# Patient Record
Sex: Female | Born: 1955 | Race: White | Hispanic: No | Marital: Married | Smoking: Never smoker
Health system: Southern US, Community
[De-identification: ages and names within clinical notes are randomized; demographics above are authoritative.]

## PROBLEM LIST (undated history)

## (undated) DIAGNOSIS — I341 Nonrheumatic mitral (valve) prolapse: Secondary | ICD-10-CM

## (undated) DIAGNOSIS — E785 Hyperlipidemia, unspecified: Secondary | ICD-10-CM

## (undated) DIAGNOSIS — F0390 Unspecified dementia without behavioral disturbance: Secondary | ICD-10-CM

## (undated) DIAGNOSIS — F0280 Dementia in other diseases classified elsewhere without behavioral disturbance: Secondary | ICD-10-CM

## (undated) DIAGNOSIS — R011 Cardiac murmur, unspecified: Secondary | ICD-10-CM

## (undated) HISTORY — PX: BREAST CYST ASPIRATION: SHX578

## (undated) HISTORY — PX: BREAST EXCISIONAL BIOPSY: SUR124

## (undated) HISTORY — PX: CHOLECYSTECTOMY: SHX55

## (undated) HISTORY — PX: BIOPSY BREAST: PRO8

---

## 2004-01-16 ENCOUNTER — Ambulatory Visit: Payer: Self-pay | Admitting: Unknown Physician Specialty

## 2005-02-22 ENCOUNTER — Ambulatory Visit: Payer: Self-pay | Admitting: Unknown Physician Specialty

## 2005-02-23 ENCOUNTER — Ambulatory Visit: Payer: Self-pay | Admitting: Unknown Physician Specialty

## 2005-12-20 ENCOUNTER — Ambulatory Visit: Payer: Self-pay | Admitting: Neurology

## 2006-07-13 ENCOUNTER — Ambulatory Visit: Payer: Self-pay | Admitting: Unknown Physician Specialty

## 2006-09-06 ENCOUNTER — Ambulatory Visit: Payer: Self-pay | Admitting: General Surgery

## 2006-11-25 ENCOUNTER — Ambulatory Visit: Payer: Self-pay | Admitting: Internal Medicine

## 2007-08-03 ENCOUNTER — Ambulatory Visit: Payer: Self-pay | Admitting: Unknown Physician Specialty

## 2007-08-17 ENCOUNTER — Ambulatory Visit: Payer: Self-pay | Admitting: Unknown Physician Specialty

## 2008-10-16 ENCOUNTER — Ambulatory Visit: Payer: Self-pay | Admitting: Unknown Physician Specialty

## 2009-10-26 ENCOUNTER — Emergency Department: Payer: Self-pay | Admitting: Internal Medicine

## 2009-11-02 ENCOUNTER — Ambulatory Visit: Payer: Self-pay | Admitting: Internal Medicine

## 2009-11-04 ENCOUNTER — Ambulatory Visit: Payer: Self-pay | Admitting: Unknown Physician Specialty

## 2010-11-17 ENCOUNTER — Ambulatory Visit: Payer: Self-pay | Admitting: Unknown Physician Specialty

## 2010-11-18 ENCOUNTER — Ambulatory Visit: Payer: Self-pay | Admitting: Unknown Physician Specialty

## 2011-02-11 ENCOUNTER — Observation Stay: Payer: Self-pay | Admitting: Internal Medicine

## 2011-11-22 ENCOUNTER — Ambulatory Visit: Payer: Self-pay | Admitting: Unknown Physician Specialty

## 2013-03-26 ENCOUNTER — Ambulatory Visit: Payer: Self-pay | Admitting: Unknown Physician Specialty

## 2014-12-09 ENCOUNTER — Emergency Department: Payer: BC Managed Care – PPO

## 2014-12-09 ENCOUNTER — Encounter: Payer: Self-pay | Admitting: Emergency Medicine

## 2014-12-09 DIAGNOSIS — R079 Chest pain, unspecified: Secondary | ICD-10-CM | POA: Insufficient documentation

## 2014-12-09 LAB — COMPREHENSIVE METABOLIC PANEL
ALBUMIN: 4.6 g/dL (ref 3.5–5.0)
ALK PHOS: 91 U/L (ref 38–126)
ALT: 19 U/L (ref 14–54)
AST: 19 U/L (ref 15–41)
Anion gap: 6 (ref 5–15)
BUN: 15 mg/dL (ref 6–20)
CALCIUM: 9.4 mg/dL (ref 8.9–10.3)
CO2: 30 mmol/L (ref 22–32)
Chloride: 100 mmol/L — ABNORMAL LOW (ref 101–111)
Creatinine, Ser: 0.78 mg/dL (ref 0.44–1.00)
GFR calc Af Amer: >60 mL/min (ref >60)
GFR calc non Af Amer: >60 mL/min (ref >60)
GLUCOSE: 103 mg/dL — AB (ref 65–99)
POTASSIUM: 3.5 mmol/L (ref 3.5–5.1)
Sodium: 136 mmol/L (ref 135–145)
Total Bilirubin: 0.6 mg/dL (ref 0.3–1.2)
Total Protein: 7.3 g/dL (ref 6.5–8.1)

## 2014-12-09 LAB — CBC
HEMATOCRIT: 41 % (ref 35.0–47.0)
Hemoglobin: 14.2 g/dL (ref 12.0–16.0)
MCH: 30.8 pg (ref 26.0–34.0)
MCHC: 34.6 g/dL (ref 32.0–36.0)
MCV: 89.1 fL (ref 80.0–100.0)
Platelets: 245 10*3/uL (ref 150–440)
RBC: 4.61 MIL/uL (ref 3.80–5.20)
RDW: 13.2 % (ref 11.5–14.5)
WBC: 5.3 10*3/uL (ref 3.6–11.0)

## 2014-12-09 LAB — TROPONIN I

## 2014-12-09 NOTE — ED Notes (Signed)
Pt arrived to the ED for complaints of chest pain and "feeling tired all the time." Pt states that she went to see her primary health care provider and she told him about the symptoms and he referred her to the ED for suspicion of an aortic aneurism, since her father had one for 15 years. Pt also reports that she had has mitral valve prolapse and she has recurrent cardiac symptoms. Pt is AOx4  In no apparent distress in triage.

## 2014-12-10 ENCOUNTER — Emergency Department
Admission: EM | Admit: 2014-12-10 | Discharge: 2014-12-10 | Discharge status: Against Medical Advice | Payer: BC Managed Care – PPO | Attending: Emergency Medicine | Admitting: Emergency Medicine

## 2014-12-10 HISTORY — DX: Nonrheumatic mitral (valve) prolapse: I34.1

## 2014-12-10 HISTORY — DX: Cardiac murmur, unspecified: R01.1

## 2014-12-10 HISTORY — DX: Hyperlipidemia, unspecified: E78.5

## 2014-12-11 ENCOUNTER — Telehealth: Payer: Self-pay | Admitting: Emergency Medicine

## 2014-12-11 NOTE — ED Notes (Signed)
Called patient due to lwot to inquire about condition and follow up plans. Left message with my number. 

## 2015-07-31 ENCOUNTER — Other Ambulatory Visit: Payer: Self-pay | Admitting: Physician Assistant

## 2015-07-31 DIAGNOSIS — Z1231 Encounter for screening mammogram for malignant neoplasm of breast: Secondary | ICD-10-CM

## 2015-08-12 ENCOUNTER — Ambulatory Visit: Payer: BC Managed Care – PPO | Attending: Physician Assistant

## 2015-09-12 ENCOUNTER — Other Ambulatory Visit: Payer: Self-pay | Admitting: Physician Assistant

## 2015-09-12 ENCOUNTER — Ambulatory Visit
Admission: RE | Admit: 2015-09-12 | Discharge: 2015-09-12 | Disposition: A | Discharge status: Home / Self Care | Payer: BC Managed Care – PPO | Source: Ambulatory Visit | Attending: Physician Assistant | Admitting: Physician Assistant

## 2015-09-12 DIAGNOSIS — Z1231 Encounter for screening mammogram for malignant neoplasm of breast: Secondary | ICD-10-CM | POA: Diagnosis present

## 2016-07-27 ENCOUNTER — Other Ambulatory Visit: Payer: Self-pay | Admitting: Neurology

## 2016-07-27 DIAGNOSIS — R208 Other disturbances of skin sensation: Secondary | ICD-10-CM

## 2016-08-05 ENCOUNTER — Other Ambulatory Visit: Payer: Self-pay | Admitting: Physician Assistant

## 2016-08-05 DIAGNOSIS — Z1231 Encounter for screening mammogram for malignant neoplasm of breast: Secondary | ICD-10-CM

## 2016-08-10 ENCOUNTER — Ambulatory Visit: Payer: BC Managed Care – PPO

## 2016-08-19 ENCOUNTER — Ambulatory Visit
Admission: RE | Admit: 2016-08-19 | Discharge: 2016-08-19 | Disposition: A | Discharge status: Home / Self Care | Payer: BC Managed Care – PPO | Source: Ambulatory Visit | Attending: Neurology | Admitting: Neurology

## 2016-08-19 DIAGNOSIS — R208 Other disturbances of skin sensation: Secondary | ICD-10-CM | POA: Insufficient documentation

## 2016-08-19 MED ORDER — GADOBENATE DIMEGLUMINE 529 MG/ML IV SOLN
15.0000 mL | Freq: Once | INTRAVENOUS | Status: AC | PRN
Start: 2016-08-19 — End: 2016-08-19
  Administered 2016-08-19: 14 mL via INTRAVENOUS

## 2016-09-14 ENCOUNTER — Ambulatory Visit: Payer: BC Managed Care – PPO

## 2016-09-14 ENCOUNTER — Ambulatory Visit
Admission: RE | Admit: 2016-09-14 | Discharge: 2016-09-14 | Disposition: A | Discharge status: Home / Self Care | Payer: BC Managed Care – PPO | Source: Ambulatory Visit | Attending: Physician Assistant | Admitting: Physician Assistant

## 2016-09-14 DIAGNOSIS — Z1231 Encounter for screening mammogram for malignant neoplasm of breast: Secondary | ICD-10-CM | POA: Diagnosis present

## 2016-12-06 ENCOUNTER — Other Ambulatory Visit: Payer: Self-pay | Admitting: Neurology

## 2016-12-06 DIAGNOSIS — M542 Cervicalgia: Secondary | ICD-10-CM

## 2016-12-13 ENCOUNTER — Ambulatory Visit
Admission: RE | Admit: 2016-12-13 | Discharge: 2016-12-13 | Disposition: A | Discharge status: Home / Self Care | Payer: BC Managed Care – PPO | Source: Ambulatory Visit | Attending: Neurology | Admitting: Neurology

## 2016-12-13 DIAGNOSIS — M542 Cervicalgia: Secondary | ICD-10-CM

## 2016-12-13 DIAGNOSIS — M5030 Other cervical disc degeneration, unspecified cervical region: Secondary | ICD-10-CM | POA: Diagnosis not present

## 2016-12-13 DIAGNOSIS — M4802 Spinal stenosis, cervical region: Secondary | ICD-10-CM | POA: Diagnosis not present

## 2017-06-09 ENCOUNTER — Emergency Department
Admission: EM | Admit: 2017-06-09 | Discharge: 2017-06-09 | Disposition: A | Discharge status: Home / Self Care | Payer: BC Managed Care – PPO | Attending: Emergency Medicine | Admitting: Emergency Medicine

## 2017-06-09 ENCOUNTER — Encounter: Payer: Self-pay | Admitting: Emergency Medicine

## 2017-06-09 DIAGNOSIS — B349 Viral infection, unspecified: Secondary | ICD-10-CM | POA: Insufficient documentation

## 2017-06-09 DIAGNOSIS — R52 Pain, unspecified: Secondary | ICD-10-CM | POA: Diagnosis present

## 2017-06-09 LAB — INFLUENZA PANEL BY PCR (TYPE A & B)
INFLBPCR: NEGATIVE
Influenza A By PCR: NEGATIVE

## 2017-06-09 MED ORDER — NAPROXEN 500 MG PO TABS: 500.0000 mg | ORAL_TABLET | Freq: Two times a day (BID) | ORAL | 0 refills | Status: DC

## 2017-06-09 MED ORDER — ONDANSETRON 4 MG PO TBDP: 4.0000 mg | ORAL_TABLET | Freq: Three times a day (TID) | ORAL | 0 refills | Status: DC | PRN

## 2017-06-09 MED ORDER — NAPROXEN 500 MG PO TABS
500.0000 mg | ORAL_TABLET | Freq: Once | ORAL | Status: AC
  Administered 2017-06-09: 500 mg via ORAL
  Filled 2017-06-09: qty 1

## 2017-06-09 MED ORDER — ONDANSETRON 4 MG PO TBDP
4.0000 mg | ORAL_TABLET | Freq: Once | ORAL | Status: AC
  Administered 2017-06-09: 4 mg via ORAL
  Filled 2017-06-09: qty 1

## 2017-06-09 NOTE — ED Triage Notes (Signed)
Pt comes into the ED via POV c/o generalized body aches, headache, fever, and emesis x 3 today.  Patient in NAD at this time with even and unlabored respirations.  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  Patient in NAD with even and unlabored respirations.  Patient took tylenol at 18:00 and is afebrile at this time.

## 2017-06-09 NOTE — ED Provider Notes (Signed)
[facility] Emergency Department Provider Note  ____________________________________________  Time seen: Approximately 9:39 PM  I have reviewed the triage vital signs and the nursing notes.   HISTORY  Chief Complaint Generalized Body Aches and Fever   HPI Susan Patterson is a 62 y.o. female who presents to the emergency department for evaluation of generalized fever, body aches, nausea, malaise which started this morning. She denies cough, congestion, or abdominal pain. 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 No recent travel. She did have her flu shot. She has taken tylenol with relief.   Past Medical History:  Diagnosis Date  . Heart murmur   . Hyperlipidemia   . Mitral valve prolapse     There are no active problems to display for this patient.   Past Surgical History:  Procedure Laterality Date  . BIOPSY BREAST    . BREAST CYST ASPIRATION Left 1990'S  . BREAST EXCISIONAL BIOPSY Left 1990's   benign  . CHOLECYSTECTOMY      Prior to Admission medications   Medication Sig Start Date End Date Taking? Authorizing Provider  naproxen (NAPROSYN) 500 MG tablet Take 1 tablet (500 mg total) by mouth 2 (two) times daily with a meal. 06/09/17   Lestat Golob B, FNP  ondansetron (ZOFRAN-ODT) 4 MG disintegrating tablet Take 1 tablet (4 mg total) by mouth every 8 (eight) hours as needed for nausea or vomiting. 06/09/17   Chinita Pesterriplett, Aniylah Avans B, FNP    Allergies Patient has no known allergies.  Family History  Problem Relation Age of Onset  . AAA (abdominal aortic aneurysm) Father   . Breast cancer Maternal Aunt     Social History Social History   Tobacco Use  . Smoking status: Never Smoker  . Smokeless tobacco: Never Used  Substance Use Topics  . Alcohol use: No  . Drug use: No    Review of Systems Constitutional: Positive fever/chills ENT: Negative sore throat. Cardiovascular: Denies chest pain. Respiratory: Negative for  shortness of breath. Negative for cough. Gastrointestinal: Positive for nausea,  no vomiting.  Negative for diarrhea.  Musculoskeletal: Positive for body aches Skin: Negative for rash. Neurological: Positive for headaches ____________________________________________   PHYSICAL EXAM:  VITAL SIGNS: ED Triage Vitals  Enc Vitals Group     BP 06/09/17 1928 102/77     Pulse Rate 06/09/17 1928 89     Resp 06/09/17 1928 16     Temp 06/09/17 1928 99 F (37.2 C)     Temp Source 06/09/17 1928 Oral     SpO2 06/09/17 1928 96 %     Weight 06/09/17 1930 145 lb (65.8 kg)     Height 06/09/17 1930 5\' 6"  (1.676 m)     Head Circumference --      Peak Flow --      Pain Score 06/09/17 1929 8     Pain Loc --      Pain Edu? --      Excl. in [place]? --     Constitutional: Alert and oriented. Acutely ill appearing and in no acute distress. Eyes: Conjunctivae are normal. EOMI. Ears: Bilateral TM normal Nose: No sinus congestion noted; no rhinnorhea. Mouth/Throat: Mucous membranes are moist.  Oropharynx normal without erythema. Tonsils normal without exudate. Neck: No stridor.  Lymphatic: Tender anterior cervical lymphadenopathy. Cardiovascular: Normal rate, regular rhythm. Good peripheral circulation. Respiratory: Normal respiratory effort.  No retractions. Breath sounds clear to auscultation. Gastrointestinal: Soft and nontender.  Musculoskeletal: FROM x 4 extremities.  Neurologic:  Normal speech and language.  Skin:  Skin is warm, dry and intact. No rash noted. Psychiatric: Mood and affect are normal. Speech and behavior are normal.  ____________________________________________   LABS (all labs ordered are listed, but only abnormal results are displayed)  Labs Reviewed  INFLUENZA PANEL BY PCR (TYPE A & B)   ____________________________________________  EKG  Not indicated. ____________________________________________  RADIOLOGY  Not  indicated. ____________________________________________   PROCEDURES  Procedure(s) performed: None  Critical Care performed: No ____________________________________________   INITIAL IMPRESSION / ASSESSMENT AND PLAN / ED COURSE  62 y.o. female who presents to the emergency department for treatment and evaluation of symptoms and exam most consistent with a viral syndrome.  She was given Naprosyn and Zofran while here in the emergency department.  She will also be given a prescription for the same. She was encouraged to return to the ER for symptoms that change or worsen if unable to schedule an appointment with her primary care provider.  Medications  ondansetron (ZOFRAN-ODT) disintegrating tablet 4 mg (4 mg Oral Given 06/09/17 2209)  naproxen (NAPROSYN) tablet 500 mg (500 mg Oral Given 06/09/17 2209)    ED Discharge Orders        Ordered    naproxen (NAPROSYN) 500 MG tablet  2 times daily with meals     06/09/17 2155    ondansetron (ZOFRAN-ODT) 4 MG disintegrating tablet  Every 8 hours PRN     06/09/17 2155       Pertinent labs & imaging results that were available during my care of the patient were reviewed by me and considered in my medical decision making (see chart for details).    If controlled substance prescribed during this visit, 12 month history viewed on the [facility] prior to issuing an initial prescription for Schedule II or III opiod. ____________________________________________   FINAL CLINICAL IMPRESSION(S) / ED DIAGNOSES  Final diagnoses:  Viral syndrome    Note:  This document was prepared using Dragon voice recognition software and may include unintentional dictation errors.     Chinita Pester, FNP 06/09/17 4098    Minna Antis, MD 06/10/17 0005

## 2017-06-09 NOTE — ED Notes (Signed)
Pt reports last night she developed muscle aches, fever at home of 100 degrees. Pt reports N/V and "just not feeling well." Pt states she had the flu last year and reports this feels the same. 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 Pt A&O at this time, equal and unlabored resp noted.

## 2017-09-07 ENCOUNTER — Other Ambulatory Visit: Payer: Self-pay | Admitting: Physician Assistant

## 2017-09-07 DIAGNOSIS — Z1231 Encounter for screening mammogram for malignant neoplasm of breast: Secondary | ICD-10-CM

## 2017-09-26 ENCOUNTER — Ambulatory Visit
Admission: RE | Admit: 2017-09-26 | Discharge: 2017-09-26 | Disposition: A | Discharge status: Home / Self Care | Payer: BC Managed Care – PPO | Source: Ambulatory Visit | Attending: Physician Assistant | Admitting: Physician Assistant

## 2017-09-26 DIAGNOSIS — Z1231 Encounter for screening mammogram for malignant neoplasm of breast: Secondary | ICD-10-CM | POA: Insufficient documentation

## 2018-01-24 IMAGING — MG MM DIGITAL SCREENING BILAT W/ TOMO W/ CAD
8 of 12 series · 8 of 28 positions shown · non-contrast
Comparison: Previous exam(s).

CLINICAL DATA: Screening.

EXAM:
2D DIGITAL SCREENING BILATERAL MAMMOGRAM WITH CAD AND ADJUNCT TOMO

[R MLO synth-2D]
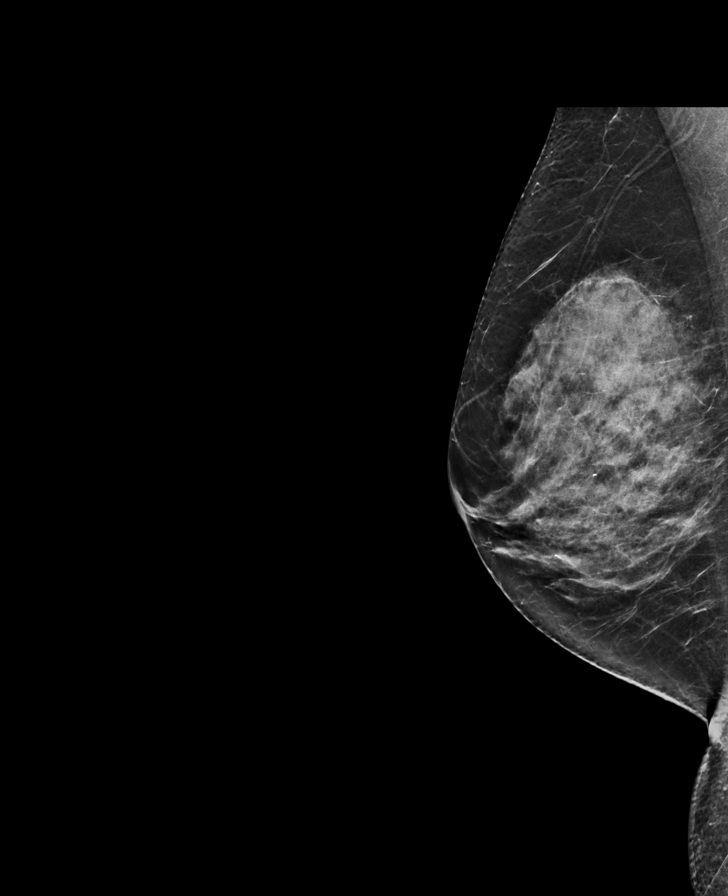

[L CC]
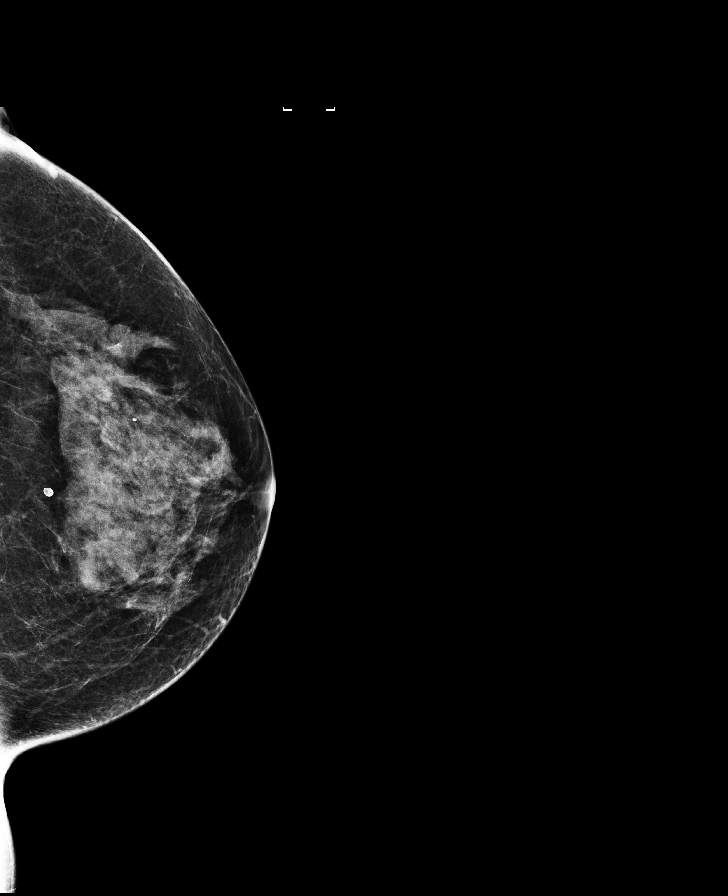

[L MLO synth-2D]
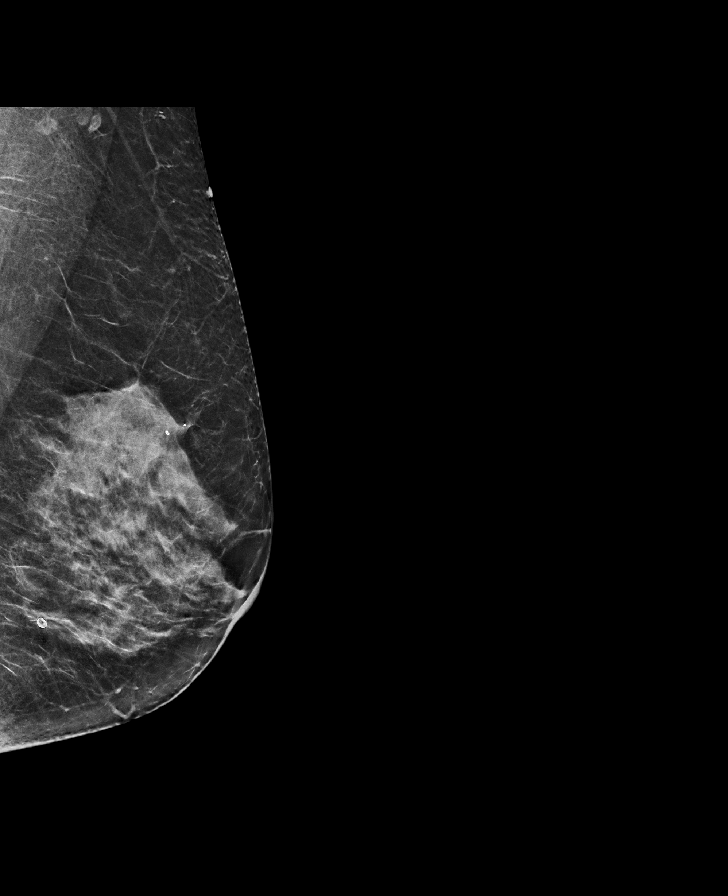

[R CC synth-2D]
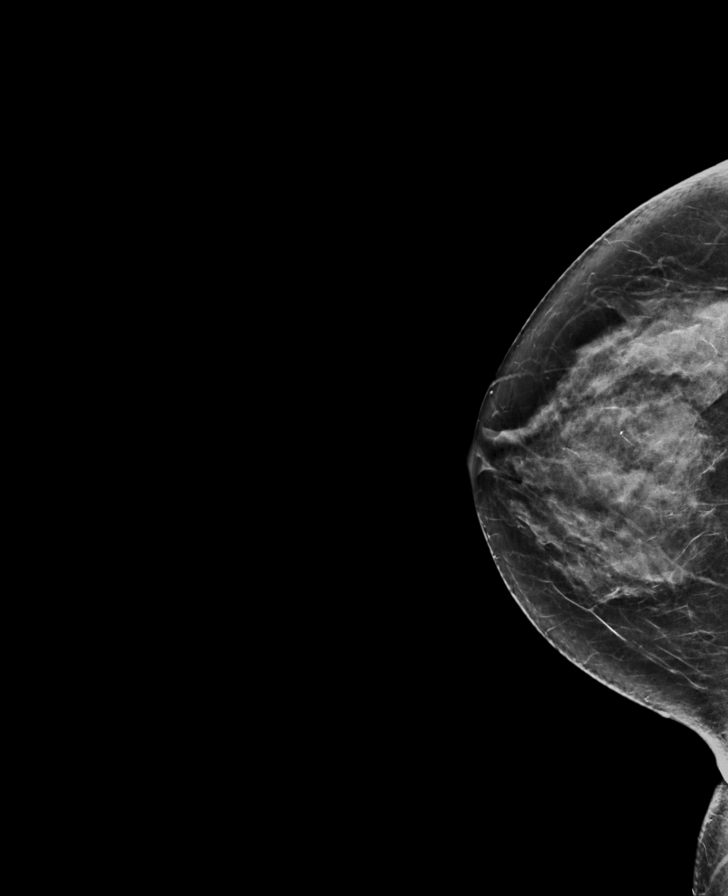

[L MLO]
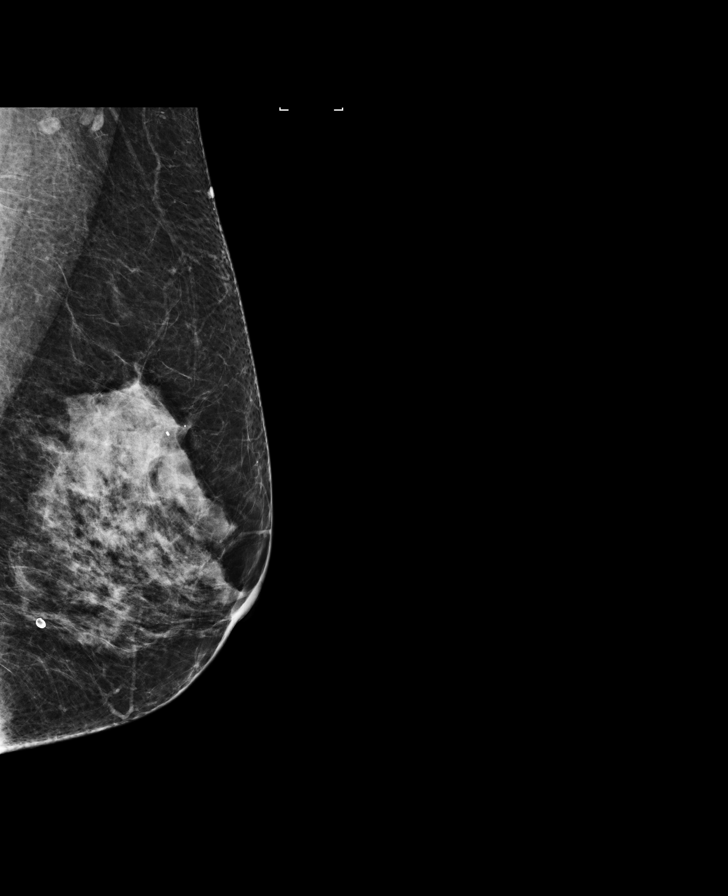

[R CC]
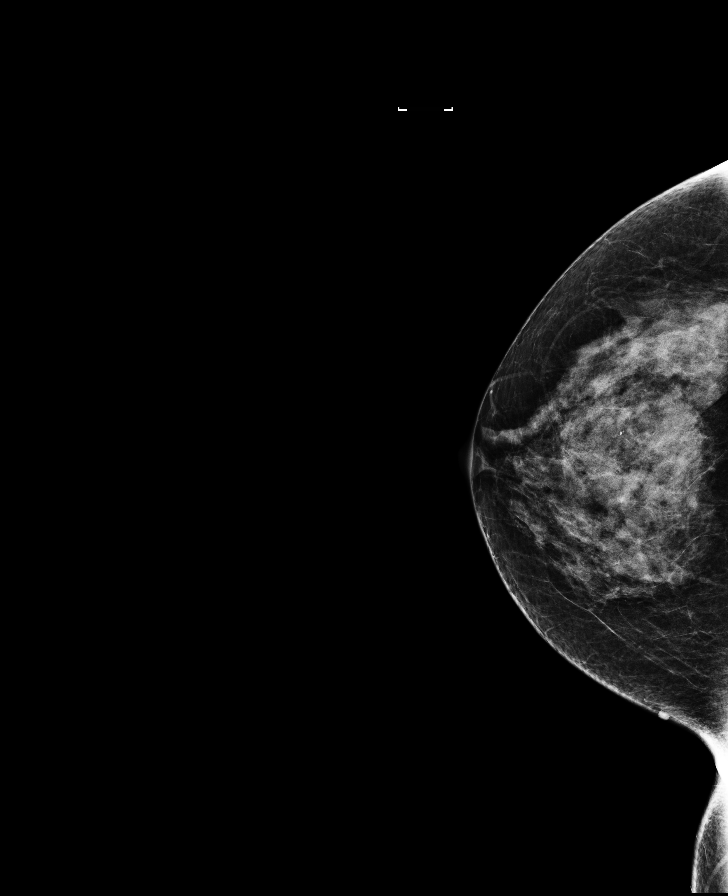

[L CC synth-2D]
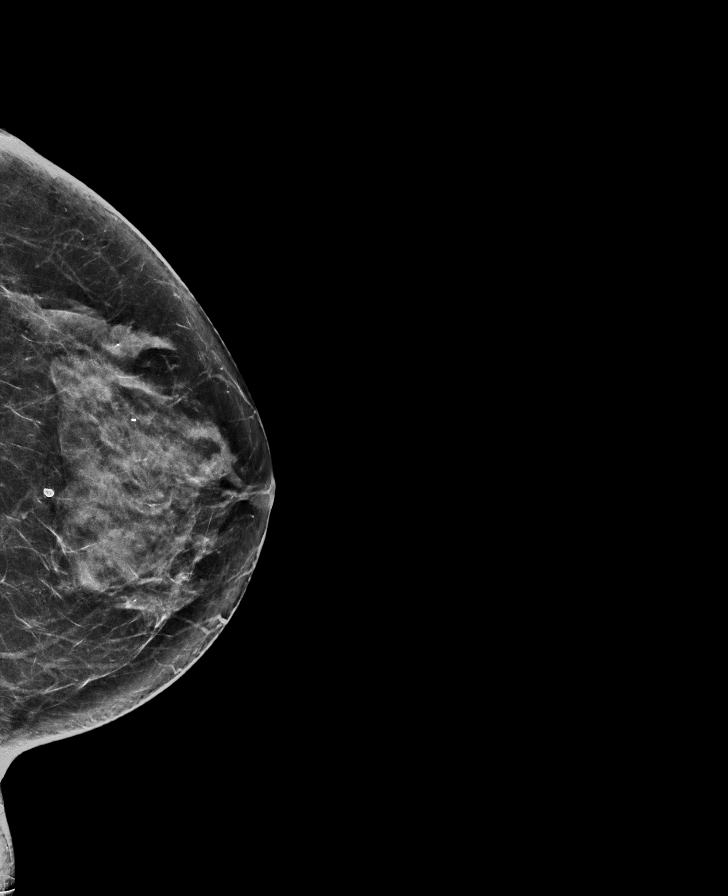

[R MLO]
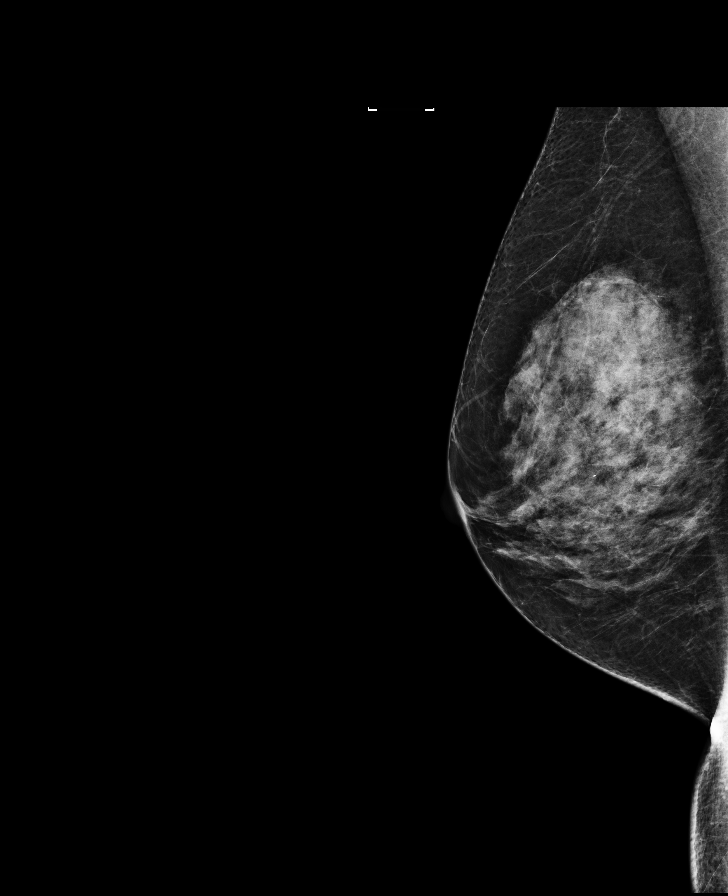

[8 of 28 positions shown; findings below may reference images not displayed]

ACR Breast Density Category c: The breast tissue is heterogeneously
dense, which may obscure small masses.
FINDINGS: There are no findings suspicious for malignancy. Images were
processed with CAD.
IMPRESSION: No mammographic evidence of malignancy. A result letter of this
screening mammogram will be mailed directly to the patient.

RECOMMENDATION:
Screening mammogram in one year. (Code:[id number])

BI-RADS CATEGORY  1: Negative.

## 2018-09-05 ENCOUNTER — Other Ambulatory Visit: Payer: Self-pay | Admitting: Physician Assistant

## 2018-09-05 DIAGNOSIS — Z1231 Encounter for screening mammogram for malignant neoplasm of breast: Secondary | ICD-10-CM

## 2018-10-13 ENCOUNTER — Ambulatory Visit
Admission: RE | Admit: 2018-10-13 | Discharge: 2018-10-13 | Disposition: A | Discharge status: Home / Self Care | Payer: BC Managed Care – PPO | Source: Ambulatory Visit | Attending: Physician Assistant | Admitting: Physician Assistant

## 2018-10-13 DIAGNOSIS — Z1231 Encounter for screening mammogram for malignant neoplasm of breast: Secondary | ICD-10-CM | POA: Insufficient documentation

## 2018-10-17 ENCOUNTER — Other Ambulatory Visit: Payer: Self-pay | Admitting: Physician Assistant

## 2018-10-17 DIAGNOSIS — N6310 Unspecified lump in the right breast, unspecified quadrant: Secondary | ICD-10-CM

## 2018-10-17 DIAGNOSIS — R928 Other abnormal and inconclusive findings on diagnostic imaging of breast: Secondary | ICD-10-CM

## 2018-10-26 ENCOUNTER — Ambulatory Visit
Admission: RE | Admit: 2018-10-26 | Discharge: 2018-10-26 | Disposition: A | Discharge status: Home / Self Care | Payer: BC Managed Care – PPO | Source: Ambulatory Visit | Attending: Physician Assistant | Admitting: Physician Assistant

## 2018-10-26 DIAGNOSIS — N6310 Unspecified lump in the right breast, unspecified quadrant: Secondary | ICD-10-CM

## 2018-10-26 DIAGNOSIS — R928 Other abnormal and inconclusive findings on diagnostic imaging of breast: Secondary | ICD-10-CM

## 2019-06-22 ENCOUNTER — Emergency Department: Payer: BC Managed Care – PPO

## 2019-06-22 ENCOUNTER — Other Ambulatory Visit: Payer: Self-pay

## 2019-06-22 DIAGNOSIS — R2241 Localized swelling, mass and lump, right lower limb: Secondary | ICD-10-CM | POA: Diagnosis present

## 2019-06-22 NOTE — ED Triage Notes (Addendum)
Patient c/o right lower leg swelling beginning at knee radiating down to calf. Patient concerned about the possibility of blood clot. Patient referred by urgent care.

## 2019-06-22 NOTE — ED Notes (Signed)
Pt up to desk again to report husband has encouraged her to stay. Pt states "I just hope it's fast". Pt ambulatory without difficulty.

## 2019-06-22 NOTE — ED Notes (Signed)
Pt states she does not think she can wait much longer. Pt updated mulitple times about wait. Pt continues to verbalize concern regarding wait. Pt asking MD to come to lobby and see her. Pt informed that at this time a physician is not able to do that.

## 2019-06-23 ENCOUNTER — Emergency Department
Admission: EM | Admit: 2019-06-23 | Discharge: 2019-06-23 | Disposition: A | Discharge status: Home / Self Care | Payer: BC Managed Care – PPO | Attending: Student | Admitting: Student

## 2019-06-23 DIAGNOSIS — M7989 Other specified soft tissue disorders: Secondary | ICD-10-CM

## 2019-06-23 NOTE — ED Notes (Signed)
Pt already assessed by EDP for right lower leg pain, cleared for DC, pt anxious to be DC'd - [place]"  Pt without further complaints att, pt ambulatory to discharge without difficulty

## 2019-06-23 NOTE — Discharge Instructions (Signed)
Thank you for letting us take care of you in the ER today. Your ultrasound was negative for blood clots.   Use ibuprofen or Aleve as directed on the box to help with pain/inflammation. You can apply ice to the area as well. You can trial an over the counter Ace wrap or knee brace to see if that helps as well.   Follow up with your regular doctor in ~1 week to ensure your symptoms are improving.  Return to the ER for any new or worsening symptoms.

## 2019-06-23 NOTE — ED Provider Notes (Signed)
[facility] Emergency Department Provider Note  ____________________________________________   First MD Initiated Contact with Patient 06/23/19 0036     (approximate)  I have reviewed the triage vital signs and the nursing notes.  History  Chief Complaint Leg Swelling    HPI Susan Patterson is a 64 y.o. female who presents to the emergency department from urgent care for ultrasound of her right lower extremity.  Patient states her right lateral knee and proximal lower leg area was feeling "different" than normal. Symptoms present x 2-3 days, no apparent alleviating or aggravating components. Symptoms seem to come and go at random w/o any identifiable triggers. She denies any discrete pain, but states it just "felt off".  She has a history of neuropathy, but states this sensation still seemed different.  She states that she initially presented to urgent care because the Shaune Pollack was telling her to make sure her symptoms were not related to a blood clot.  She denies any noticeable swelling to the RLE, but states that urgent care measured her legs and said her RLE was slightly larger than the left by 1-2 cm.  She denies any history of blood clots.  Not on any hormonal medications.  No recent prolonged immobilization.  No fevers, redness, or warmth.  She denies any preceding trauma or twisting, but does lift and play with several of her grandchildren.   Past Medical Hx Past Medical History:  Diagnosis Date  . Heart murmur   . Hyperlipidemia   . Mitral valve prolapse     Problem List There are no problems to display for this patient.   Past Surgical Hx Past Surgical History:  Procedure Laterality Date  . BIOPSY BREAST    . BREAST CYST ASPIRATION Left 1990'S  . BREAST EXCISIONAL BIOPSY Left 1990's   benign  . CHOLECYSTECTOMY      Medications Prior to Admission medications   Medication Sig Start Date End Date Taking? Authorizing Provider  naproxen  (NAPROSYN) 500 MG tablet Take 1 tablet (500 mg total) by mouth 2 (two) times daily with a meal. 06/09/17   Triplett, Cari B, FNP  ondansetron (ZOFRAN-ODT) 4 MG disintegrating tablet Take 1 tablet (4 mg total) by mouth every 8 (eight) hours as needed for nausea or vomiting. 06/09/17   Chinita Pester, FNP    Allergies Patient has no known allergies.  Family Hx Family History  Problem Relation Age of Onset  . AAA (abdominal aortic aneurysm) Father   . Breast cancer Maternal Aunt     Social Hx Social History   Tobacco Use  . Smoking status: Never Smoker  . Smokeless tobacco: Never Used  Substance Use Topics  . Alcohol use: No  . Drug use: No     Review of Systems  Constitutional: Negative for fever. Negative for chills. Eyes: Negative for visual changes. ENT: Negative for sore throat. Cardiovascular: Negative for chest pain. Respiratory: Negative for shortness of breath. Gastrointestinal: Negative for nausea. Negative for vomiting.  Genitourinary: Negative for dysuria. Musculoskeletal: Negative for leg swelling. + RLE feeling "different" Skin: Negative for rash. Neurological: Negative for headaches.   Physical Exam  Vital Signs: ED Triage Vitals  Enc Vitals Group     BP 06/22/19 2000 (!) 147/95     Pulse Rate 06/22/19 2000 (!) 55     Resp 06/22/19 2000 18     Temp 06/22/19 2000 97.8 F (36.6 C)     Temp Source 06/22/19 2000 Oral  SpO2 06/22/19 2000 99 %     Weight 06/22/19 2001 150 lb (68 kg)     Height 06/22/19 2001 5\' 7"  (1.702 m)     Head Circumference --      Peak Flow --      Pain Score 06/22/19 2012 3     Pain Loc --      Pain Edu? --      Excl. in [place]? --     Constitutional: Alert and oriented. Well appearing. NAD.  Head: Normocephalic. Atraumatic. Eyes: Conjunctivae clear. Sclera anicteric. Pupils equal and symmetric. Nose: No masses or lesions. No congestion or rhinorrhea. Mouth/Throat: Wearing mask.  Neck: No stridor. Trachea midline.    Cardiovascular: Normal rate, regular rhythm. Extremities well perfused.  Toes warm and well-perfused. Respiratory: Normal respiratory effort.   Genitourinary: Deferred. Musculoskeletal: No deformities or evidence of trauma. No appreciable swelling or asymmetry of the RLE compared to the LLE. NT about the anterior, medial, and lateral knee joint line. No knee instability. No joint effusion or warmth or redness. No TTP to calf, along the tib/fib, or about the ankle or foot. RLE DP pulse 2+. Toes WWP. FROM bilateral hip, knee, ankle without discomfort. Strength 5/5. Sensation in tact. Compartments soft and compressible.  Neurologic:  Normal speech and language. No gross focal or lateralizing neurologic deficits are appreciated.  Skin: Skin is warm, dry and intact. No rash noted. Psychiatric: Mood and affect are appropriate for situation.  EKG  N/A   Radiology  Personally reviewed available imaging myself.   US IMPRESSION:  No evidence of deep venous thrombosis.    Procedures  Procedure(s) performed (including critical care):  Procedures   Initial Impression / Assessment and Plan / MDM / ED Course  64 y.o. female who presents to the ED from urgent care to r/o RLE blood clot. States her RLE was feeling "different" and she wanted to make sure it was not related to DVT. No hx of DVT. No significant risk factors by history.  Ddx: neuropathy, arthritis, MSK, DVT. No evidence on exam for cellulitis or joint infection. No deformities or evidence of trauma.   Will plan for [place]  [place] negative for DVT.  Updated patient on results.  Advised supportive care with ice, NSAIDs, compression as needed.  Patient voices understanding and is comfortable with the plan and discharge.  Advised PCP follow-up and given return precautions.   _______________________________   As part of my medical decision making I have reviewed available labs, radiology tests, reviewed old records/chart  review.    Final Clinical Impression(s) / ED Diagnosis  Final diagnoses:  Leg swelling       Note:  This document was prepared using Dragon voice recognition software and may include unintentional dictation errors.   Lilia Pro., MD 06/23/19 [phone]

## 2019-06-25 ENCOUNTER — Ambulatory Visit: Payer: BC Managed Care – PPO | Attending: Internal Medicine

## 2019-06-25 DIAGNOSIS — Z23 Encounter for immunization: Secondary | ICD-10-CM

## 2019-06-25 NOTE — Progress Notes (Signed)
   Covid-19 Vaccination Clinic  Name:  Susan Patterson    MRN: [id number] DOB: 05/09/1955  06/25/2019  [name] was observed post Covid-19 immunization for 15 minutes without incident. She was provided with Vaccine Information Sheet and instruction to access the V-Safe system.   [name] was instructed to call 911 with any severe reactions post vaccine: Marland Kitchen Difficulty breathing  . Swelling of face and throat  . A fast heartbeat  . A bad rash all over body  . Dizziness and weakness   Immunizations Administered    Name Date Dose VIS Date Route   Pfizer COVID-19 Vaccine 06/25/2019 10:48 AM 0.3 mL 05/02/2018 Intramuscular   Manufacturer: [employer], [employer]   Lot: [id number]   NDC: [address]-6

## 2019-07-17 ENCOUNTER — Ambulatory Visit: Payer: BC Managed Care – PPO | Attending: Internal Medicine

## 2019-07-17 DIAGNOSIS — Z23 Encounter for immunization: Secondary | ICD-10-CM

## 2019-07-17 NOTE — Progress Notes (Signed)
   Covid-19 Vaccination Clinic  Name:  Susan Patterson    MRN: [id number] DOB: Feb 17, 1956  07/17/2019  Ms. Susan Patterson was observed post Covid-19 immunization for 15 minutes without incident. She was provided with Vaccine Information Sheet and instruction to access the V-Safe system.   Ms. Susan Patterson was instructed to call 911 with any severe reactions post vaccine: Marland Kitchen Difficulty breathing  . Swelling of face and throat  . A fast heartbeat  . A bad rash all over body  . Dizziness and weakness   Immunizations Administered    Name Date Dose VIS Date Route   Pfizer COVID-19 Vaccine 07/17/2019 10:35 AM 0.3 mL 05/02/2018 Intramuscular   Manufacturer: [employer], [employer]   Lot: [id number]   NDC: [address]-5

## 2019-09-17 ENCOUNTER — Other Ambulatory Visit: Payer: Self-pay | Admitting: Physician Assistant

## 2019-09-17 DIAGNOSIS — Z1231 Encounter for screening mammogram for malignant neoplasm of breast: Secondary | ICD-10-CM

## 2019-10-15 ENCOUNTER — Other Ambulatory Visit: Payer: Self-pay

## 2019-10-15 ENCOUNTER — Ambulatory Visit
Admission: RE | Admit: 2019-10-15 | Discharge: 2019-10-15 | Disposition: A | Discharge status: Home / Self Care | Payer: BC Managed Care – PPO | Source: Ambulatory Visit | Attending: Physician Assistant | Admitting: Physician Assistant

## 2019-10-15 DIAGNOSIS — Z1231 Encounter for screening mammogram for malignant neoplasm of breast: Secondary | ICD-10-CM | POA: Insufficient documentation

## 2019-10-29 ENCOUNTER — Other Ambulatory Visit
Admission: RE | Admit: 2019-10-29 | Discharge: 2019-10-29 | Disposition: A | Discharge status: Home / Self Care | Payer: BC Managed Care – PPO | Source: Ambulatory Visit | Attending: Rehabilitative and Restorative Service Providers" | Admitting: Rehabilitative and Restorative Service Providers"

## 2019-10-29 DIAGNOSIS — R0789 Other chest pain: Secondary | ICD-10-CM | POA: Insufficient documentation

## 2019-10-29 DIAGNOSIS — Z8616 Personal history of COVID-19: Secondary | ICD-10-CM | POA: Diagnosis not present

## 2019-10-29 LAB — BRAIN NATRIURETIC PEPTIDE: B Natriuretic Peptide: 65.3 pg/mL (ref 0.0–100.0)

## 2019-10-29 LAB — FIBRIN DERIVATIVES D-DIMER (ARMC ONLY): Fibrin derivatives D-dimer (ARMC): 327.1 ng/mL (FEU) (ref 0.00–499.00)

## 2019-11-22 ENCOUNTER — Other Ambulatory Visit (HOSPITAL_COMMUNITY): Payer: Self-pay | Admitting: Neurology

## 2019-11-22 DIAGNOSIS — R4189 Other symptoms and signs involving cognitive functions and awareness: Secondary | ICD-10-CM

## 2019-12-05 ENCOUNTER — Other Ambulatory Visit: Payer: Self-pay

## 2019-12-05 ENCOUNTER — Ambulatory Visit (HOSPITAL_COMMUNITY)
Admission: RE | Admit: 2019-12-05 | Discharge: 2019-12-05 | Disposition: A | Discharge status: Home / Self Care | Payer: BC Managed Care – PPO | Source: Ambulatory Visit | Attending: Neurology | Admitting: Neurology

## 2019-12-05 DIAGNOSIS — R4189 Other symptoms and signs involving cognitive functions and awareness: Secondary | ICD-10-CM | POA: Insufficient documentation

## 2020-05-14 ENCOUNTER — Other Ambulatory Visit: Payer: Self-pay

## 2020-05-14 ENCOUNTER — Ambulatory Visit: Payer: BC Managed Care – PPO | Attending: Physician Assistant | Admitting: Speech Pathology

## 2020-05-14 DIAGNOSIS — R4701 Aphasia: Secondary | ICD-10-CM | POA: Insufficient documentation

## 2020-05-14 DIAGNOSIS — F0280 Dementia in other diseases classified elsewhere without behavioral disturbance: Secondary | ICD-10-CM | POA: Diagnosis present

## 2020-05-14 DIAGNOSIS — G3101 Pick's disease: Secondary | ICD-10-CM | POA: Insufficient documentation

## 2020-05-14 DIAGNOSIS — R41841 Cognitive communication deficit: Secondary | ICD-10-CM | POA: Diagnosis present

## 2020-05-15 ENCOUNTER — Encounter: Payer: Self-pay | Admitting: Speech Pathology

## 2020-05-15 NOTE — Therapy (Signed)
[place] [facility] MAIN [facility] [address] [place], [place], [address] Phone: [phone]   Fax:  [phone]  Speech Language Pathology Evaluation  Patient Details  Name: Susan Patterson MRN: [id number] Date of Birth: 10/27/1955 Referring Provider (SLP): Patrice ParadiseMiriam K. McLaughlin, MD   Encounter Date: 05/14/2020   End of Session - 05/15/20 0828    Visit Number 1    Number of Visits 25    Date for SLP Re-Evaluation 08/12/20    Authorization Type BCBS    Authorization - Visit Number 1    Progress Note Due on Visit 10    SLP Start Time 1400    SLP Stop Time  1500    SLP Time Calculation (min) 60 min    Activity Tolerance Patient tolerated treatment well           Past Medical History:  Diagnosis Date  . Heart murmur   . Hyperlipidemia   . Mitral valve prolapse     Past Surgical History:  Procedure Laterality Date  . BIOPSY BREAST    . BREAST CYST ASPIRATION Left 1990'S  . BREAST EXCISIONAL BIOPSY Left 1990's   benign  . CHOLECYSTECTOMY      There were no vitals filed for this visit.   Subjective Assessment - 05/15/20 0824    Subjective "Not [place], [place]. See, that's what I do."    Currently in Pain? No/denies              [facility] - 05/15/20 [id number]      SLP Visit Information   SLP Received On 05/14/20    Referring Provider (SLP) Patrice ParadiseMiriam K. 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 female who was recommended to attend speech therapy following neuropsych testing. 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 On 12/06/19 pt had mRI brain which showed decreased normative whole brain volume  (in 15th percentile). She has been diagnosed with mild cognitive impairment. Per pt, she feels symptoms worsened after Covid-19 infection and Covid-19 vaccination.  alert, cooperative    Mobility Status ambulated unassisted      Balance Screen   Has the patient fallen in the past 6 months No    Has the patient had a decrease in activity level because of a fear of falling?  No    Is the patient reluctant to leave their home because of a fear of falling?  No      Prior Functional Status   Cognitive/Linguistic Baseline Within functional limits    Type of Home House    Vocation Part time employment   retired Runner, broadcasting/film/videoteacher, works part time as Hotel manageradmin assistant     Cognition   Overall Cognitive Status Impaired/Different from baseline    Area of Impairment Attention;Memory;Awareness   Not formally assessed today but suspect deficits in these areas   Current Attention Level Sustained    Attention Comments SLP had to redirect to task, topic on occasion    Memory Comments Pt reports deficits; suspect this is actually language impairment. To be assessed further    Awareness Intellectual  Awareness Comments explains away errors "that's not like the one I usually see," or "i haven't seen that in a long time"      Auditory Comprehension   Overall Auditory Comprehension Other (comment)   at conversation level appears functional, needs repetition of less familiar concepts, words. will formally assess; suspect single word comprehension may be impaired   Yes/No Questions Not tested   to be further assessed   Commands Not tested   to be further assessed   Conversation Moderately complex    Other Conversation Comments took some notes, needed repetition and help spelling    EffectiveTechniques Repetition;Stressing words;Extra processing time;Pausing;Slowed speech      Visual Recognition/Discrimination   Discrimination Exceptions to [facility]    Common Objects Able for objects in room    Black/White Line Drawings  Other (comment)   appeared not to recognize some line drawings (eg. whistle, mushroom, tennis raquet, volcano, harmonica, rhinoceros, acorn) with cues able to match word from F:4 to picture ~75% accuracy     Reading Comprehension   Reading Status Not tested   to be assessed further     Expression   Primary Mode of Expression Verbal      Verbal Expression   Overall Verbal Expression Impaired    Initiation No impairment    Automatic Speech Name;Social Response    Level of Generative/Spontaneous Verbalization Conversation    Naming Impairment    Confrontation 25-49% accurate   50/60 pts for naming objects, 14/33 items correct for B+W drawings   Verbal Errors Semantic paraphasias;Aware of errors;Not aware of errors    Pragmatics Impairment    Impairments Topic maintenance    Interfering Components Attention    Effective Techniques Written cues   semantic cues, phonemic cues, articulatory cues were not helpful   Non-Verbal Means of Communication Not applicable    Other Verbal Expression Comments tangential; when encountering errors explained away deficits, often turned to discussing something unrelated that she feels she does successfully      Written Expression   Dominant Hand Right    Written Expression Not tested   will assess further     Oral Motor/Sensory Function   Overall Oral Motor/Sensory Function Appears within functional limits for tasks assessed      Motor Speech   Overall Motor Speech Appears within functional limits for tasks assessed    Respiration Within functional limits    Phonation Normal    Resonance Within functional limits    Articulation Within functional limitis    Intelligibility Intelligible    Motor Planning Witnin functional limits    Phonation WFL      Standardized Assessments   Standardized Assessments  Boston Naming Test-2nd edition;Western Aphasia Battery revised   initiated; to be completed in next 1-4 sessions   Boston Naming Test-2nd edition   14/33   did not complete: Pt, "I'm not really in to this now."   Western Aphasia Battery revised  Object naming only; 50/60                           SLP Education - 05/14/20 1639    Education Details Language problems she is having are not consistent with normal aging. (What is aphasia? Neuroplasticity handouts provided)    Person(s) Educated Patient    Methods Explanation;Handout    Comprehension Verbalized understanding;Verbal cues required;Need further instruction            SLP Short Term Goals -  05/15/20 0937      SLP SHORT TERM GOAL #1   Title Patient will verbalize 3 cognitive-linguistic deficits.    Time 10    Period --   sessions   Status New      SLP SHORT TERM GOAL #2   Title Patient will complete home activities for cognitive-linguistic exercise at least 85% of the time.    Time 10    Period --   sessions   Status New      SLP SHORT TERM GOAL #3   Title Patient will use compensations for anomia in simple wordfinding tasks with moderate cues.    Time 10   sessions   Status New            SLP Long Term Goals - 05/15/20 0960      SLP LONG TERM GOAL #1   Title Patient will identify >75% errors in simple-mod complex linguistic tasks with min cues.    Time 12    Period Weeks    Status New      SLP LONG TERM GOAL #2   Title Patient will name or relay semantic features of less-common objects >75% accuracy with compensations allowed.    Time 12    Period Weeks    Status New      SLP LONG TERM GOAL #3   Title Patient will verbalize basic concepts of neuroplasticity and appropriate activities for cognitive stimulation.    Time 12    Period Weeks    Status New            Plan - 05/15/20 4540    Clinical Impression Statement Susan Patterson presents with aphasia and suspected cognitive communication deficits; severity appears to be in range of mild to moderate, to be confirmed with completion of standardized testing in next 1-4 sessions.  Her speech is fluent and unlabored, although dysnomia and anomia occurs occasionally in conversation ([place], fish oil), and pt reports this is a common occurence. Patient reports having difficulty remembering names, and needing "to think a little bit more about a word." She is hopeful that is is just part of normal aging, expressed awareness that she had been diagnosed with "mild cognitive impairment" but is praying that this will "go away." Significant impairment in confrontation naming with black and white images; correctly named 14/33 items administered from the [facility]. At times errors appeared to be anomic, as she was able to discuss function or name a related word (such as airplane/helicopter, world/globe, "hang that on a door"/wreath). However in most instances pt appeared to have difficulty identifying images of well-known but less frequently used items such as tennis racket, whistle, harmonica, acorn, volcano, wheelchair, mushroom. Pt tendency was to explain away errors, stating image was "not like" the objects she is familiar with or to say "I don't see those often." SLP educated pt that these type of errors are not consistent with normal aging, and signify that she has a language impairment. She had many questions about what is causing this problem; such as whether this could be related to taking fish oil, or her Covid-19 vaccination or illness. Educated on causes of aphasia, and that per MD notes her work-up has shown hers is most consistent with a neurodegenerative process. Educated that neurology/MD role is to identify cause/diagnosis, SLP role is to target improving communication abilities and assist with managing symptoms of her language impairment. Patient is not sure about committing to a full course of ST  at this time; we discussed working to complete further assessment and for SLP to work with pt on training strategies/compensations for aphasia and develop home  activities for her to promote neuroplasticity. Affirmed that understanding and accepting a diagnosis is a difficult process, and SLP will try to meet pt where she is in that process. I recommend skilled ST to address language and cognitive-communication impairments, in order to maximize and preserve language function, and train patient in strategies to reduce the burden of her communication disorder.      Speech Therapy Frequency 2x / week   pt unsure at this time if she will be able to attend at this frequency   Duration 12 weeks    Treatment/Interventions Language facilitation;Environmental controls;Cueing hierarchy;SLP instruction and feedback;Compensatory techniques;Cognitive reorganization;Functional tasks;Compensatory strategies;Internal/external aids;Multimodal communcation approach;Patient/family education    Potential to Achieve Goals Fair    Potential Considerations Cooperation/participation level;Other (comment)   awareness/insight   Consulted and Agree with Plan of Care Patient           Patient will benefit from skilled therapeutic intervention in order to improve the following deficits and impairments:   Aphasia - Plan: SLP plan of care cert/re-cert  Cognitive communication deficit - Plan: SLP plan of care cert/re-cert  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  Susan Baton, MS, CCC-SLP Speech-Language Pathologist  Arlana Lindau 05/15/2020, 9:58 AM  [facility] MAIN [facility] SERVICES [address] [place], [place], [address] Phone: [phone]   Fax:  [phone]  Name: Susan Patterson MRN: [id number] Date of Birth: [birth year]

## 2020-05-20 ENCOUNTER — Encounter: Payer: BC Managed Care – PPO | Admitting: Speech Pathology

## 2020-05-22 ENCOUNTER — Encounter: Payer: BC Managed Care – PPO | Admitting: Speech Pathology

## 2020-05-26 ENCOUNTER — Encounter: Payer: BC Managed Care – PPO | Admitting: Speech Pathology

## 2020-05-28 ENCOUNTER — Ambulatory Visit: Payer: BC Managed Care – PPO | Admitting: Speech Pathology

## 2020-05-29 ENCOUNTER — Telehealth: Payer: Self-pay | Admitting: Speech Pathology

## 2020-05-29 NOTE — Telephone Encounter (Signed)
SLP called pt re: NS for appointment 05/28/20 10:00. Informed of next appointment on 06/04/20 at 10:00 and pt verbalized she would be present for this appointment.  Rondel Baton, MS, Sports administrator

## 2020-05-30 ENCOUNTER — Encounter (INDEPENDENT_AMBULATORY_CARE_PROVIDER_SITE_OTHER): Payer: Self-pay | Admitting: Vascular Surgery

## 2020-05-30 ENCOUNTER — Encounter (INDEPENDENT_AMBULATORY_CARE_PROVIDER_SITE_OTHER): Payer: Self-pay

## 2020-06-02 ENCOUNTER — Ambulatory Visit: Payer: BC Managed Care – PPO | Admitting: Speech Pathology

## 2020-06-04 ENCOUNTER — Other Ambulatory Visit: Payer: Self-pay

## 2020-06-04 ENCOUNTER — Ambulatory Visit: Payer: BC Managed Care – PPO | Admitting: Speech Pathology

## 2020-06-04 DIAGNOSIS — F0280 Dementia in other diseases classified elsewhere without behavioral disturbance: Secondary | ICD-10-CM

## 2020-06-04 DIAGNOSIS — R4701 Aphasia: Secondary | ICD-10-CM

## 2020-06-04 DIAGNOSIS — G3101 Pick's disease: Secondary | ICD-10-CM

## 2020-06-04 DIAGNOSIS — R41841 Cognitive communication deficit: Secondary | ICD-10-CM

## 2020-06-04 NOTE — Therapy (Signed)
[place] [facility] MAIN [facility] SERVICES [address] [place], [place], [address] Phone: [phone]   Fax:  [phone]  Speech Language Pathology Treatment  Patient Details  Name: Susan Patterson MRN: [id number] Date of Birth: 12-17-1955 Referring Provider (SLP): Patrice Paradise, MD   Encounter Date: 06/04/2020   End of Session - 06/04/20 1354    Visit Number 2    Number of Visits 25    Date for SLP Re-Evaluation 08/12/20    Authorization Type BCBS    Authorization - Visit Number 2    Progress Note Due on Visit 10    SLP Start Time 1000    SLP Stop Time  1100    SLP Time Calculation (min) 60 min    Activity Tolerance Patient tolerated treatment well           Past Medical History:  Diagnosis Date  . Heart murmur   . Hyperlipidemia   . Mitral valve prolapse     Past Surgical History:  Procedure Laterality Date  . BIOPSY BREAST    . BREAST CYST ASPIRATION Left 1990'S  . BREAST EXCISIONAL BIOPSY Left 1990's   benign  . CHOLECYSTECTOMY      There were no vitals filed for this visit.   Subjective Assessment - 06/04/20 1346    Subjective "Mild covid, I mean mild cognitive."    Currently in Pain? No/denies                 ADULT SLP TREATMENT - 06/04/20 1347      General Information   Behavior/Cognition Alert;Cooperative    HPI Susan Patterson is a 65 y.o. female who was recommended to attend speech therapy following neuropsych testing. 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 On 12/06/19 pt had mRI brain which showed decreased normative whole brain volume (in 15th percentile). She has been diagnosed with mild cognitive impairment. Per pt, she feels symptoms worsened after Covid-19 infection and Covid-19 vaccination.      Treatment Provided   Treatment provided Cognitive-Linquistic      Pain Assessment   Pain Assessment No/denies pain      Cognitive-Linquistic  Treatment   Treatment focused on Aphasia;Patient/family/caregiver education    Skilled Treatment Opened session by inviting/answering pt questions. Pt brought articles from a magazine and wanted to know about taking advertised medications for cognitive function. SLP pointed out that these articles are labeled as advertisements, but that it is not within SLP scope to provide advice about medications; strongly encouraged her to discuss any medications with Dr. Sherryll Burger prior to taking. Pt curious about why she is still functioning well in so many areas if she has mild cognitive impairment. Educated that there are many domains of cognition, and that with assessment thus far, pt is exhibiting difficulties with language. Completed BNT; no items were correct from 34-60. Total score was 14/60. Began administering Cognitive Linguistic Quick Test, to be completed next session. Pt reported difficulty remembering names; SLP advised using a notebook to write down/take notes on people/details she would like to remember, and pt agreed this was a good idea.      Assessment / Recommendations / Plan   Plan Continue with current plan of care      Progression Toward Goals   Progression toward goals Progressing toward goals            SLP Education - 06/04/20 1353    Education Details pt should consult her  doctor about taking any new medications or supplements    Person(s) Educated Patient    Methods Explanation    Comprehension Verbalized understanding            SLP Short Term Goals - 05/15/20 9983      SLP SHORT TERM GOAL #1   Title Patient will verbalize 3 cognitive-linguistic deficits.    Time 10    Period --   sessions   Status New      SLP SHORT TERM GOAL #2   Title Patient will complete home activities for cognitive-linguistic exercise at least 85% of the time.    Time 10    Period --   sessions   Status New      SLP SHORT TERM GOAL #3   Title Patient will use compensations for anomia in  simple wordfinding tasks with moderate cues.    Time 10   sessions   Status New            SLP Long Term Goals - 05/15/20 3825      SLP LONG TERM GOAL #1   Title Patient will identify >75% errors in simple-mod complex linguistic tasks with min cues.    Time 12    Period Weeks    Status New      SLP LONG TERM GOAL #2   Title Patient will name or relay semantic features of less-common objects >75% accuracy with compensations allowed.    Time 12    Period Weeks    Status New      SLP LONG TERM GOAL #3   Title Patient will verbalize basic concepts of neuroplasticity and appropriate activities for cognitive stimulation.    Time 12    Period Weeks    Status New            Plan - 06/04/20 1356    Clinical Impression Statement Susan Patterson presents with aphasia and suspected cognitive communication deficits; severity appears to be in range of mild to moderate, to be confirmed with completion of standardized testing in next 1-4 sessions. Boston Naming Test completed with score of 14/60. CLQT initiated today. Patient receptive to SLP education re: aphasia compensations and strategies for remembering names. I recommend skilled ST to address language and cognitive-communication impairments, in order to maximize and preserve language function, and train patient in strategies to reduce the burden of her communication disorder.    Speech Therapy Frequency 2x / week   pt unsure at this time if she will be able to attend at this frequency   Duration 12 weeks    Treatment/Interventions Language facilitation;Environmental controls;Cueing hierarchy;SLP instruction and feedback;Compensatory techniques;Cognitive reorganization;Functional tasks;Compensatory strategies;Internal/external aids;Multimodal communcation approach;Patient/family education    Potential to Achieve Goals Fair    Potential Considerations Cooperation/participation level;Other (comment)   awareness/insight   Consulted and Agree  with Plan of Care Patient           Patient will benefit from skilled therapeutic intervention in order to improve the following deficits and impairments:   Aphasia  Cognitive communication deficit  Primary progressive aphasia (HCC)    Problem List There are no problems to display for this patient.  Susan Baton, MS, CCC-SLP Speech-Language Pathologist  Susan Patterson 06/04/2020, 1:56 PM  [place] [facility] MAIN [facility] SERVICES [address] [place], [place], [address] Phone: [phone]   Fax:  [phone]   Name: Susan Patterson MRN: [id number] Date of Birth: 04-Mar-1956

## 2020-06-09 ENCOUNTER — Ambulatory Visit: Payer: BC Managed Care – PPO | Admitting: Speech Pathology

## 2020-06-11 ENCOUNTER — Other Ambulatory Visit (INDEPENDENT_AMBULATORY_CARE_PROVIDER_SITE_OTHER): Payer: Self-pay | Admitting: Vascular Surgery

## 2020-06-11 ENCOUNTER — Ambulatory Visit: Payer: BC Managed Care – PPO | Admitting: Speech Pathology

## 2020-06-11 DIAGNOSIS — R4701 Aphasia: Secondary | ICD-10-CM

## 2020-06-12 ENCOUNTER — Ambulatory Visit: Payer: BC Managed Care – PPO | Attending: Physician Assistant | Admitting: Speech Pathology

## 2020-06-12 ENCOUNTER — Other Ambulatory Visit: Payer: Self-pay

## 2020-06-12 DIAGNOSIS — R4701 Aphasia: Secondary | ICD-10-CM | POA: Insufficient documentation

## 2020-06-12 DIAGNOSIS — F0280 Dementia in other diseases classified elsewhere without behavioral disturbance: Secondary | ICD-10-CM | POA: Diagnosis present

## 2020-06-12 DIAGNOSIS — G3101 Pick's disease: Secondary | ICD-10-CM | POA: Insufficient documentation

## 2020-06-12 DIAGNOSIS — R41841 Cognitive communication deficit: Secondary | ICD-10-CM | POA: Diagnosis present

## 2020-06-12 NOTE — Therapy (Signed)
[facility] MAIN [facility] SERVICES [address] [place], [place], [address] Phone: [phone]   Fax:  [phone]  Speech Language Pathology Treatment  Patient Details  Name: Susan Patterson MRN: [id number] Date of Birth: May 02, 1955 Referring Provider (SLP): Patrice Paradise, MD   Encounter Date: 06/12/2020   End of Session - 06/12/20 1719    Visit Number 3    Number of Visits 25    Date for SLP Re-Evaluation 08/12/20    Authorization Type BCBS    Authorization - Visit Number 3    Progress Note Due on Visit 10    SLP Start Time 1600    SLP Stop Time  1655    SLP Time Calculation (min) 55 min    Activity Tolerance Patient tolerated treatment well           Past Medical History:  Diagnosis Date  . Heart murmur   . Hyperlipidemia   . Mitral valve prolapse     Past Surgical History:  Procedure Laterality Date  . BIOPSY BREAST    . BREAST CYST ASPIRATION Left 1990'S  . BREAST EXCISIONAL BIOPSY Left 1990's   benign  . CHOLECYSTECTOMY      There were no vitals filed for this visit.   Subjective Assessment - 06/12/20 1712    Subjective "Did you order something for my carotid?"    Currently in Pain? No/denies                 ADULT SLP TREATMENT - 06/12/20 1713      General Information   Behavior/Cognition Alert;Cooperative    HPI Susan Patterson is a 65 y.o. female who was recommended to attend speech therapy following neuropsych testing. 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 On 12/06/19 pt had mRI brain which showed decreased normative whole brain volume (in 15th percentile). She has been diagnosed with mild cognitive impairment. Per pt, she feels symptoms worsened after Covid-19 infection and Covid-19 vaccination.      Treatment Provided   Treatment provided Cognitive-Linquistic      Pain Assessment   Pain Assessment No/denies pain      Cognitive-Linquistic  Treatment   Treatment focused on Aphasia;Patient/family/caregiver education    Skilled Treatment Completed Cognitive Linguistic Quick Test, see below for findings/interpretation. Introduced Primary school teacher, self-cuing strategies for wordfinding difficulties. Confrontation naming 50% accuracy; pt explained anomia (ex. binoculars, cemetary) by saying she wouldn't need to say those words. Despite this, pt was willing and able to provide semantic features (physical attributes, function, associations, locations) with min question cues. Mod complex categorization 90% accuracy. Provided home tasks as well as education on everyday tasks she can do to stimulate and maintain language ability.      Assessment / Recommendations / Plan   Plan Continue with current plan of care      Progression Toward Goals   Progression toward goals Progressing toward goals       AGE - 18 - 69   Cognitive Linguistic Quick Test   The Cognitive Linguistic Quick Test (CLQT) was administered to assess the relative status of five cognitive domains: attention, memory, language, executive functioning, and visuospatial skills. Scores from 10 tasks were used to estimate severity ratings (for age groups 18-69 years and 70-89 years) for each domain, a clock drawing task, as well as an overall composite severity rating of cognition.        Task    Score  Criterion  Cut Scores   Personal Facts    8/8   8    Symbol Cancellation            12/12   11   Confrontation Naming    10/10             10   Clock Drawing      11/13  12   Story Retelling       6/10   6   Symbol Trails                 10/10  9   Generative Naming               4/9   5   Design Memory      6/6   5   Mazes        6/8   7   Design Generation                 9/13   6   Cognitive Domain  Composite Score Severity Rating   Attention   195/215  WNL    Memory   150/185  Mild   Executive Function  29/40   WNL   Language   28/37   Mild    Visuospatial Skills  95/105  WNL   Clock Drawing  11/13   Mild   Composite Severity Rating WNL           SLP Education - 06/12/20 1719    Education Details semantic feature analysis    Person(s) Educated Patient    Methods Explanation;Demonstration;Verbal cues    Comprehension Verbalized understanding;Need further instruction            SLP Short Term Goals - 05/15/20 0937      SLP SHORT TERM GOAL #1   Title Patient will verbalize 3 cognitive-linguistic deficits.    Time 10    Period --   sessions   Status New      SLP SHORT TERM GOAL #2   Title Patient will complete home activities for cognitive-linguistic exercise at least 85% of the time.    Time 10    Period --   sessions   Status New      SLP SHORT TERM GOAL #3   Title Patient will use compensations for anomia in simple wordfinding tasks with moderate cues.    Time 10   sessions   Status New            SLP Long Term Goals - 05/15/20 8250      SLP LONG TERM GOAL #1   Title Patient will identify >75% errors in simple-mod complex linguistic tasks with min cues.    Time 12    Period Weeks    Status New      SLP LONG TERM GOAL #2   Title Patient will name or relay semantic features of less-common objects >75% accuracy with compensations allowed.    Time 12    Period Weeks    Status New      SLP LONG TERM GOAL #3   Title Patient will verbalize basic concepts of neuroplasticity and appropriate activities for cognitive stimulation.    Time 12    Period Weeks    Status New            Plan - 06/12/20 1719    Clinical Impression Statement Susan Patterson presents with mild aphasia; cognitive testing via CLQT reveals WNL function  for attention, executive function, visuospatial skills, and clock drawing. Memory and Language are mildly impaired. Pt had more difficulty with verbal memory vs visual memory. Patient receptive to SLP education re: aphasia compensations and strategies for remembering names. I  recommend skilled ST to address language and cognitive-communication impairments, in order to maximize and preserve language function, and train patient in strategies to reduce the burden of her communication disorder.    Speech Therapy Frequency 2x / week   pt unsure at this time if she will be able to attend at this frequency   Duration 12 weeks    Treatment/Interventions Language facilitation;Environmental controls;Cueing hierarchy;SLP instruction and feedback;Compensatory techniques;Cognitive reorganization;Functional tasks;Compensatory strategies;Internal/external aids;Multimodal communcation approach;Patient/family education    Potential to Achieve Goals Fair    Potential Considerations Cooperation/participation level;Other (comment)   awareness/insight   Consulted and Agree with Plan of Care Patient           Patient will benefit from skilled therapeutic intervention in order to improve the following deficits and impairments:   Aphasia  Cognitive communication deficit  Primary progressive aphasia (HCC)    Problem List There are no problems to display for this patient.  Rondel Baton, MS, CCC-SLP Speech-Language Pathologist  Susan Patterson 06/12/2020, 5:22 PM  [place] [facility] MAIN [facility] SERVICES [address] [place], [place], [address] Phone: [phone]   Fax:  [phone]   Name: Susan Patterson MRN: [id number] Date of Birth: 11/14/1955

## 2020-06-13 ENCOUNTER — Encounter (INDEPENDENT_AMBULATORY_CARE_PROVIDER_SITE_OTHER): Payer: Self-pay | Admitting: Vascular Surgery

## 2020-06-13 ENCOUNTER — Encounter (INDEPENDENT_AMBULATORY_CARE_PROVIDER_SITE_OTHER): Payer: Self-pay

## 2020-06-16 ENCOUNTER — Ambulatory Visit: Payer: BC Managed Care – PPO | Admitting: Speech Pathology

## 2020-06-18 ENCOUNTER — Ambulatory Visit: Payer: BC Managed Care – PPO | Admitting: Speech Pathology

## 2020-06-18 DIAGNOSIS — R41841 Cognitive communication deficit: Secondary | ICD-10-CM

## 2020-06-18 DIAGNOSIS — R4701 Aphasia: Secondary | ICD-10-CM | POA: Diagnosis not present

## 2020-06-18 DIAGNOSIS — F0280 Dementia in other diseases classified elsewhere without behavioral disturbance: Secondary | ICD-10-CM

## 2020-06-19 NOTE — Therapy (Signed)
[facility] MAIN [facility] SERVICES [address] [place], [place], [address] Phone: [phone]   Fax:  [phone]  Speech Language Pathology Treatment  Patient Details  Name: Susan Patterson MRN: [id number] Date of Birth: 1955-07-22 Referring Provider (SLP): Patrice Paradise, MD   Encounter Date: 06/18/2020   End of Session - 06/19/20 0841    Visit Number 4    Number of Visits 25    Date for SLP Re-Evaluation 08/12/20    Authorization Type BCBS    Authorization - Visit Number 4    Progress Note Due on Visit 10    SLP Start Time 1002    SLP Stop Time  1055    SLP Time Calculation (min) 53 min    Activity Tolerance Patient tolerated treatment well           Past Medical History:  Diagnosis Date  . Heart murmur   . Hyperlipidemia   . Mitral valve prolapse     Past Surgical History:  Procedure Laterality Date  . BIOPSY BREAST    . BREAST CYST ASPIRATION Left 1990'S  . BREAST EXCISIONAL BIOPSY Left 1990's   benign  . CHOLECYSTECTOMY      There were no vitals filed for this visit.   Subjective Assessment - 06/19/20 0828    Subjective "I've been busy with the grands."    Currently in Pain? No/denies                 ADULT SLP TREATMENT - 06/19/20 0829      General Information   Behavior/Cognition Alert;Cooperative    HPI Susan Patterson is a 65 y.o. female who was recommended to attend speech therapy following neuropsych testing. 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 On 12/06/19 pt had mRI brain which showed decreased normative whole brain volume (in 15th percentile). She has been diagnosed with mild cognitive impairment. Per pt, she feels symptoms worsened after Covid-19 infection and Covid-19 vaccination.      Treatment Provided   Treatment provided Cognitive-Linquistic      Pain Assessment   Pain Assessment No/denies pain      Cognitive-Linquistic Treatment    Treatment focused on Aphasia;Patient/family/caregiver education    Skilled Treatment Patient completed homework; attempted all tasks, able to complete 80-90%. For synonyms/antonyms, pt often used descriptive phrase vs a specific word. Required mod-max A to generate single word in these instances. Pt again stated belief that her wordfinding difficulties are part of normal aging. SLP educated on norms vs pt performance on [employer]. Pt enjoys playing games and doing activities with her grandchildren. Introduced language-focused activities pt could engage in with her family to challenge/stimulate language. Pt able to generate descriptions for nouns easily, however when SLP gave descriptions (responsive naming), pt able to name item ~50% of the time (~70% with fill-in-the-blank or sentence completion cues). Generated item beginning with /m/ for 9/12 categories. Home tasks provided.      Assessment / Recommendations / Plan   Plan Continue with current plan of care      Progression Toward Goals   Progression toward goals Not progressing toward goals (comment)   insight/awareness, acceptance           SLP Education - 06/19/20 0840    Education Details normative vs non-normative wordfinding difficulties    Person(s) Educated Patient    Methods Explanation    Comprehension Verbalized understanding  SLP Short Term Goals - 05/15/20 7944      SLP SHORT TERM GOAL #1   Title Patient will verbalize 3 cognitive-linguistic deficits.    Time 10    Period --   sessions   Status New      SLP SHORT TERM GOAL #2   Title Patient will complete home activities for cognitive-linguistic exercise at least 85% of the time.    Time 10    Period --   sessions   Status New      SLP SHORT TERM GOAL #3   Title Patient will use compensations for anomia in simple wordfinding tasks with moderate cues.    Time 10   sessions   Status New            SLP Long Term Goals - 05/15/20 4619       SLP LONG TERM GOAL #1   Title Patient will identify >75% errors in simple-mod complex linguistic tasks with min cues.    Time 12    Period Weeks    Status New      SLP LONG TERM GOAL #2   Title Patient will name or relay semantic features of less-common objects >75% accuracy with compensations allowed.    Time 12    Period Weeks    Status New      SLP LONG TERM GOAL #3   Title Patient will verbalize basic concepts of neuroplasticity and appropriate activities for cognitive stimulation.    Time 12    Period Weeks    Status New            Plan - 06/19/20 0841    Clinical Impression Statement Susan Patterson presents with mild aphasia; cognitive testing via CLQT reveals WNL function for attention, executive function, visuospatial skills, and clock drawing. Memory and Language are mildly impaired. Pt had more difficulty with verbal memory vs visual memory. Pt continues to express hope/belief that wordfinding/naming issues are normal aging. SLP continues to educate on normative vs abnormal wordfinding difficulties. I recommend skilled ST to address language and cognitive-communication impairments, in order to maximize and preserve language function, and train patient in strategies to reduce the burden of her communication disorder.    Speech Therapy Frequency 2x / week   pt unsure at this time if she will be able to attend at this frequency   Duration 12 weeks    Treatment/Interventions Language facilitation;Environmental controls;Cueing hierarchy;SLP instruction and feedback;Compensatory techniques;Cognitive reorganization;Functional tasks;Compensatory strategies;Internal/external aids;Multimodal communcation approach;Patient/family education    Potential to Achieve Goals Fair    Potential Considerations Cooperation/participation level;Other (comment)   awareness/insight   Consulted and Agree with Plan of Care Patient           Patient will benefit from skilled therapeutic  intervention in order to improve the following deficits and impairments:   Aphasia  Cognitive communication deficit  Primary progressive aphasia (HCC)    Problem List There are no problems to display for this patient.  Rondel Baton, MS, CCC-SLP Speech-Language Pathologist  Susan Patterson 06/19/2020, 8:42 AM  [facility] MAIN [facility] SERVICES [address] [place], [place], [address] Phone: [phone]   Fax:  [phone]   Name: Susan Patterson MRN: [id number] Date of Birth: May 15, 1955

## 2020-06-23 ENCOUNTER — Ambulatory Visit: Payer: BC Managed Care – PPO | Admitting: Speech Pathology

## 2020-06-25 ENCOUNTER — Ambulatory Visit: Payer: BC Managed Care – PPO | Admitting: Speech Pathology

## 2020-06-30 ENCOUNTER — Ambulatory Visit: Payer: BC Managed Care – PPO | Admitting: Speech Pathology

## 2020-07-02 ENCOUNTER — Ambulatory Visit: Payer: BC Managed Care – PPO | Admitting: Speech Pathology

## 2020-08-05 ENCOUNTER — Other Ambulatory Visit: Payer: Self-pay

## 2020-08-05 ENCOUNTER — Encounter (INDEPENDENT_AMBULATORY_CARE_PROVIDER_SITE_OTHER): Payer: Self-pay | Admitting: Vascular Surgery

## 2020-08-05 ENCOUNTER — Ambulatory Visit (INDEPENDENT_AMBULATORY_CARE_PROVIDER_SITE_OTHER): Payer: BC Managed Care – PPO | Admitting: Vascular Surgery

## 2020-08-05 ENCOUNTER — Ambulatory Visit (INDEPENDENT_AMBULATORY_CARE_PROVIDER_SITE_OTHER): Payer: BC Managed Care – PPO

## 2020-08-05 DIAGNOSIS — E785 Hyperlipidemia, unspecified: Secondary | ICD-10-CM | POA: Diagnosis not present

## 2020-08-05 DIAGNOSIS — M79606 Pain in leg, unspecified: Secondary | ICD-10-CM | POA: Insufficient documentation

## 2020-08-05 DIAGNOSIS — M79604 Pain in right leg: Secondary | ICD-10-CM

## 2020-08-05 DIAGNOSIS — R4701 Aphasia: Secondary | ICD-10-CM | POA: Diagnosis not present

## 2020-08-05 DIAGNOSIS — M79605 Pain in left leg: Secondary | ICD-10-CM

## 2020-08-05 NOTE — Assessment & Plan Note (Signed)
Patient describes lower extremity pain that does not sound vascular in nature.  It sounds more like restless leg syndrome and may be some neuropathic pain.  She does not have swelling, claudication, or symptoms that are typical of vascular disease.  We are happy to check her arterial or venous studies in the future if desired, but I will defer further work-up to her primary care physician and her neurologist at this point.

## 2020-08-05 NOTE — Progress Notes (Signed)
Patient ID: Susan Patterson, female   DOB: [birth year], 65 y.o.   MRN: [id number]  Chief Complaint  Patient presents with  . New Patient (Initial Visit)    Ref Sherryll Burger aphasia carotid    HPI Susan Patterson is a 65 y.o. female.  I am asked to see the patient by Dr. Sherryll Burger for evaluation of cognitive issues and aphasia.  The patient reports having many ongoing issues over the past year.  She started having some issues getting words out appropriately after her second COVID-vaccine last May.  Then she developed COVID which was an extended bout of COVID in August.  Following this, she has seen neurology and has been diagnosed with some cognitive decline issues.  Given these findings, she is referred for carotid evaluation.  Carotid duplex was performed today which showed no significant carotid artery disease on either side.  Patient also reports some leg pain and irritability at night.  This happens while she is laying the bed or sometimes sitting.  When she walks she has no pain.  Her legs do not swell.  No history of DVT or superficial thrombophlebitis.   Past Medical History:  Diagnosis Date  . Heart murmur   . Hyperlipidemia   . Mitral valve prolapse     Past Surgical History:  Procedure Laterality Date  . BIOPSY BREAST    . BREAST CYST ASPIRATION Left 1990'S  . BREAST EXCISIONAL BIOPSY Left 1990's   benign  . CHOLECYSTECTOMY      Family History  Problem Relation Age of Onset  . AAA (abdominal aortic aneurysm) Father   . Breast cancer Maternal Aunt    No bleeding or clotting disorders   Social History   Tobacco Use  . Smoking status: Never Smoker  . Smokeless tobacco: Never Used  Substance Use Topics  . Alcohol use: No  . Drug use: No     Allergies  Allergen Reactions  . Atorvastatin Swelling    Other reaction(s): Angioedema    Current Outpatient Medications  Medication Sig Dispense Refill  . naproxen (NAPROSYN) 500 MG tablet Take 1 tablet (500 mg  total) by mouth 2 (two) times daily with a meal. 30 tablet 0  . ondansetron (ZOFRAN-ODT) 4 MG disintegrating tablet Take 1 tablet (4 mg total) by mouth every 8 (eight) hours as needed for nausea or vomiting. 20 tablet 0   No current facility-administered medications for this visit.      REVIEW OF SYSTEMS (Negative unless checked)  Constitutional: [] Weight loss  [] Fever  [] Chills Cardiac: [] Chest pain   [] Chest pressure   [] Palpitations   [] Shortness of breath when laying flat   [] Shortness of breath at rest   [] Shortness of breath with exertion. Vascular:  [] Pain in legs with walking   [] Pain in legs at rest   [x] Pain in legs when laying flat   [] Claudication   [] Pain in feet when walking  [] Pain in feet at rest  [] Pain in feet when laying flat   [] History of DVT   [] Phlebitis   [] Swelling in legs   [] Varicose veins   [] Non-healing ulcers Pulmonary:   [] Uses home oxygen   [] Productive cough   [] Hemoptysis   [] Wheeze  [] COPD   [] Asthma Neurologic:  [] Dizziness  [] Blackouts   [] Seizures   [] History of stroke   [] History of TIA  [x] Aphasia   [] Temporary blindness   [] Dysphagia   [] Weakness or numbness in arms   [] Weakness or numbness in legs Musculoskeletal:  [] Arthritis   []   Joint swelling   [] Joint pain   [] Low back pain Hematologic:  [] Easy bruising  [] Easy bleeding   [] Hypercoagulable state   [] Anemic  [] Hepatitis Gastrointestinal:  [] Blood in stool   [] Vomiting blood  [] Gastroesophageal reflux/heartburn   [] Abdominal pain Genitourinary:  [] Chronic kidney disease   [] Difficult urination  [] Frequent urination  [] Burning with urination   [] Hematuria Skin:  [] Rashes   [] Ulcers   [] Wounds Psychological:  [] History of anxiety   []  History of major depression.    Physical Exam BP 127/78 (BP Location: Right Arm)   Pulse 60   Resp 16   Ht 5\' 7"  (1.702 m)   Wt 147 lb 3.2 oz (66.8 kg)   BMI 23.05 kg/m  Gen:  WD/WN, NAD.  Appears younger than stated age Head: [place]/AT, No temporalis wasting.   Ear/Nose/Throat: Hearing grossly intact, nares w/o erythema or drainage, oropharynx w/o Erythema/Exudate Eyes: Conjunctiva clear, sclera non-icteric  Neck: trachea midline.   Pulmonary:  Good air movement, no labored respirations Cardiac: RRR, no JVD Vascular:  Vessel Right Left  Radial Palpable Palpable                          PT Palpable Palpable  DP Palpable Palpable    Musculoskeletal: M/S 5/5 throughout.  Extremities without ischemic changes.  No deformity or atrophy.  No significant enlarged varicosities.  No edema. Neurologic: Sensation grossly intact in extremities.  Symmetrical.  Speech is fluent. Motor exam as listed above. Psychiatric: Judgment intact, Mood & affect appropriate for pt's clinical situation. Dermatologic: No rashes or ulcers noted.  No cellulitis or open wounds.    Radiology No results found.  Labs No results found for this or any previous visit (from the past 2160 hour(s)).  Assessment/Plan:  Aphasia Carotid duplex showed clean carotids bilaterally with no significant disease.  At this point, it is unlikely that a vascular issue was the cause of her symptoms.  We will see her back as needed.  Leg pain Patient describes lower extremity pain that does not sound vascular in nature.  It sounds more like restless leg syndrome and may be some neuropathic pain.  She does not have swelling, claudication, or symptoms that are typical of vascular disease.  We are happy to check her arterial or venous studies in the future if desired, but I will defer further work-up to her primary care physician and her neurologist at this point.  Hyperlipidemia lipid control important in reducing the progression of atherosclerotic disease.        08/05/2020, 9:45 AM   This note was created with Dragon medical transcription system.  Any errors from dictation are unintentional.

## 2020-08-05 NOTE — Assessment & Plan Note (Signed)
lipid control important in reducing the progression of atherosclerotic disease.   

## 2020-08-05 NOTE — Assessment & Plan Note (Signed)
Carotid duplex showed clean carotids bilaterally with no significant disease.  At this point, it is unlikely that a vascular issue was the cause of her symptoms.  We will see her back as needed.

## 2020-09-17 ENCOUNTER — Other Ambulatory Visit: Payer: Self-pay | Admitting: Physician Assistant

## 2020-09-17 DIAGNOSIS — Z1231 Encounter for screening mammogram for malignant neoplasm of breast: Secondary | ICD-10-CM

## 2020-10-10 ENCOUNTER — Ambulatory Visit: Payer: BC Managed Care – PPO | Admitting: Urology

## 2020-10-10 ENCOUNTER — Other Ambulatory Visit: Payer: Self-pay

## 2020-10-10 ENCOUNTER — Encounter: Payer: Self-pay | Admitting: Urology

## 2020-10-10 VITALS — BP 121/77 | HR 58 | Ht 66.0 in | Wt 146.0 lb

## 2020-10-10 DIAGNOSIS — N3281 Overactive bladder: Secondary | ICD-10-CM

## 2020-10-10 LAB — URINALYSIS, COMPLETE
Bilirubin, UA: NEGATIVE
Glucose, UA: NEGATIVE
Ketones, UA: NEGATIVE
Nitrite, UA: NEGATIVE
Protein,UA: NEGATIVE
RBC, UA: NEGATIVE
Specific Gravity, UA: >1.03 — ABNORMAL HIGH (ref 1.005–1.030)
Urobilinogen, Ur: 0.2 mg/dL (ref 0.2–1.0)
pH, UA: 5 (ref 5.0–7.5)

## 2020-10-10 LAB — MICROSCOPIC EXAMINATION

## 2020-10-10 LAB — BLADDER SCAN AMB NON-IMAGING

## 2020-10-10 NOTE — Progress Notes (Signed)
   10/10/2020 3:19 PM   [name] 1956/02/08 [id number]  Referring provider: Patrice Paradise, MD [address] [facility] [address] [facility] [place],  [place] [address]  Chief Complaint  Patient presents with   New Patient (Initial Visit)   Urinary Frequency    HPI: Susan Patterson is a 65 y.o. female referred for evaluation of urinary frequency.  Duration ~ 1 year Complains of urinary frequency voiding as much as 5-6 times per hour with urgency and only voiding small amounts Variable nocturia 1-3 Denies urinary incontinence, history of recurrent UTI, gross hematuria Estimates she has been treated for ~ 4 UTIs per year No bowel symptoms No prior hysterectomy No history of MS, degenerative disc disease or Parkinson's On record review she has had 1 urine culture ordered since June 2021 which was negative   PMH: Past Medical History:  Diagnosis Date   Heart murmur    Hyperlipidemia    Mitral valve prolapse     Surgical History: Past Surgical History:  Procedure Laterality Date   BIOPSY BREAST     BREAST CYST ASPIRATION Left 1990'S   BREAST EXCISIONAL BIOPSY Left 1990's   benign   CHOLECYSTECTOMY      Home Medications:  Allergies as of 10/10/2020       Reactions   Atorvastatin Swelling   Other reaction(s): Angioedema        Medication List        Accurate as of October 10, 2020  3:19 PM. If you have any questions, ask your nurse or doctor.          STOP taking these medications    naproxen 500 MG tablet Commonly known as: Naprosyn Stopped by: Riki Altes, MD   ondansetron 4 MG disintegrating tablet Commonly known as: ZOFRAN-ODT Stopped by: Riki Altes, MD        Allergies:  Allergies  Allergen Reactions   Atorvastatin Swelling    Other reaction(s): Angioedema    Family History: Family History  Problem Relation Age of Onset   AAA (abdominal aortic aneurysm) Father    Breast cancer Maternal Aunt     Social  History:  reports that she has never smoked. She has never used smokeless tobacco. She reports that she does not drink alcohol and does not use drugs.   Physical Exam: BP 121/77   Pulse (!) 58   Ht 5\' 6"  (1.676 m)   Wt 146 lb (66.2 kg)   BMI 23.57 kg/m   Constitutional:  Alert and oriented, No acute distress. HEENT: [place] AT, moist mucus membranes.  Trachea midline, no masses. Cardiovascular: No clubbing, cyanosis, or edema. Respiratory: Normal respiratory effort, no increased work of breathing. Skin: No rashes, bruises or suspicious lesions. Neurologic: Grossly intact, no focal deficits, moving all 4 extremities. Psychiatric: Normal mood and affect.  Laboratory Data:  Urinalysis 6-10 WBCs on microscopy  Assessment & Plan:    1.  Overactive bladder Bladder scan PVR 0 mL Management options were discussed including behavioral therapy, pelvic floor physical therapy and medical management with either anticholinergic medication or beta 3 agonist She states her symptoms are not bothersome of that she desires any additional treatment and came to the appointment at the recommendation of her PCP Urinalysis showed mild pyuria and a urine culture was ordered.  She is asymptomatic Follow-up prn   , MD  [facility] [address] [place], [place] [phone]

## 2020-10-14 ENCOUNTER — Telehealth: Payer: Self-pay | Admitting: Family Medicine

## 2020-10-14 LAB — CULTURE, URINE COMPREHENSIVE

## 2020-10-14 NOTE — Telephone Encounter (Signed)
Left message and informed patient of results.

## 2020-10-14 NOTE — Telephone Encounter (Signed)
-----   Message from Riki Altes, MD sent at 10/14/2020  9:34 AM EDT ----- Urine culture was negative for infection

## 2020-11-04 ENCOUNTER — Other Ambulatory Visit: Payer: Self-pay

## 2020-11-04 ENCOUNTER — Ambulatory Visit
Admission: RE | Admit: 2020-11-04 | Discharge: 2020-11-04 | Disposition: A | Discharge status: Home / Self Care | Payer: BC Managed Care – PPO | Source: Ambulatory Visit | Attending: Physician Assistant | Admitting: Physician Assistant

## 2020-11-04 DIAGNOSIS — Z1231 Encounter for screening mammogram for malignant neoplasm of breast: Secondary | ICD-10-CM | POA: Insufficient documentation

## 2021-10-02 ENCOUNTER — Other Ambulatory Visit: Payer: Self-pay | Admitting: Physician Assistant

## 2021-10-02 DIAGNOSIS — Z1231 Encounter for screening mammogram for malignant neoplasm of breast: Secondary | ICD-10-CM

## 2021-11-06 ENCOUNTER — Ambulatory Visit
Admission: RE | Admit: 2021-11-06 | Discharge: 2021-11-06 | Disposition: A | Discharge status: Home / Self Care | Payer: BC Managed Care – PPO | Source: Ambulatory Visit | Attending: Physician Assistant | Admitting: Physician Assistant

## 2021-11-06 DIAGNOSIS — Z1231 Encounter for screening mammogram for malignant neoplasm of breast: Secondary | ICD-10-CM | POA: Insufficient documentation

## 2022-01-04 ENCOUNTER — Encounter (INDEPENDENT_AMBULATORY_CARE_PROVIDER_SITE_OTHER): Payer: Self-pay

## 2022-09-09 ENCOUNTER — Emergency Department: Payer: Medicare HMO

## 2022-09-09 ENCOUNTER — Other Ambulatory Visit: Payer: Self-pay

## 2022-09-09 ENCOUNTER — Emergency Department
Admission: EM | Admit: 2022-09-09 | Discharge: 2022-09-10 | Disposition: A | Discharge status: Home / Self Care | Payer: Medicare HMO | Attending: Emergency Medicine | Admitting: Emergency Medicine

## 2022-09-09 DIAGNOSIS — S93402A Sprain of unspecified ligament of left ankle, initial encounter: Secondary | ICD-10-CM | POA: Insufficient documentation

## 2022-09-09 DIAGNOSIS — S299XXA Unspecified injury of thorax, initial encounter: Secondary | ICD-10-CM | POA: Diagnosis present

## 2022-09-09 DIAGNOSIS — S20219A Contusion of unspecified front wall of thorax, initial encounter: Secondary | ICD-10-CM | POA: Insufficient documentation

## 2022-09-09 DIAGNOSIS — Y92410 Unspecified street and highway as the place of occurrence of the external cause: Secondary | ICD-10-CM | POA: Insufficient documentation

## 2022-09-09 DIAGNOSIS — S20212A Contusion of left front wall of thorax, initial encounter: Secondary | ICD-10-CM

## 2022-09-09 MED ORDER — HYDROCODONE-ACETAMINOPHEN 5-325 MG PO TABS
1.0000 | ORAL_TABLET | Freq: Once | ORAL | Status: DC
  Filled 2022-09-09: qty 1

## 2022-09-09 MED ORDER — ONDANSETRON 4 MG PO TBDP
4.0000 mg | ORAL_TABLET | Freq: Once | ORAL | Status: DC
  Filled 2022-09-09: qty 1
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
## 2022-09-10 MED ORDER — HYDROCODONE-ACETAMINOPHEN 5-325 MG PO TABS: 1.0000 | ORAL_TABLET | Freq: Four times a day (QID) | ORAL | 0 refills | Status: AC | PRN

## 2022-09-10 MED ORDER — NALOXONE HCL 2 MG/2ML IJ SOSY: 0.5000 mg | PREFILLED_SYRINGE | Freq: Once | INTRAMUSCULAR | Status: DC

## 2022-09-10 NOTE — ED Notes (Signed)
Patient moved off the floor. Patient d/c time is 50

## 2022-09-10 NOTE — Discharge Instructions (Signed)
1.  Use incentive spirometer as instructed. 2.  You may take Tylenol and/or Ibuprofen as needed for discomfort.  Take 1/2 to 1 tablet Norco every 6 hours as needed for more severe pain. 3.  Apply moist heat to affected area several times daily. 4.  You may use Ace wrap as needed for ankle comfort. 5.  Return to the ER for worsening symptoms, persistent vomiting, difficulty breathing or other concerns.

## 2022-10-07 ENCOUNTER — Other Ambulatory Visit: Payer: Self-pay | Admitting: Physician Assistant

## 2022-10-07 DIAGNOSIS — Z1231 Encounter for screening mammogram for malignant neoplasm of breast: Secondary | ICD-10-CM

## 2022-12-22 ENCOUNTER — Ambulatory Visit: Payer: Medicare HMO | Attending: Student | Admitting: Speech Pathology

## 2022-12-22 DIAGNOSIS — G3101 Pick's disease: Secondary | ICD-10-CM | POA: Diagnosis present

## 2022-12-22 DIAGNOSIS — R41841 Cognitive communication deficit: Secondary | ICD-10-CM | POA: Insufficient documentation

## 2022-12-22 DIAGNOSIS — F0280 Dementia in other diseases classified elsewhere without behavioral disturbance: Secondary | ICD-10-CM | POA: Diagnosis present

## 2022-12-22 DIAGNOSIS — R4701 Aphasia: Secondary | ICD-10-CM | POA: Diagnosis present

## 2022-12-22 NOTE — Therapy (Addendum)
OUTPATIENT SPEECH LANGUAGE PATHOLOGY APHASIA EVALUATION   Patient Name: Susan Patterson MRN: [id number] DOB:1956/01/01, 67 y.o., female Today's Date: 12/22/2022  PCP: Maurine Minister, MD REFERRING PROVIDER: Janice Coffin, PA   End of Session - 12/22/22 1459     Visit Number 1    Number of Visits 25    Date for SLP Re-Evaluation 03/16/23    Authorization Type [employer] PPO/[employer]    Progress Note Due on Visit 10    SLP Start Time 1400    SLP Stop Time  1445    SLP Time Calculation (min) 45 min    Activity Tolerance Patient tolerated treatment well             Past Medical History:  Diagnosis Date   Heart murmur    Hyperlipidemia    Mitral valve prolapse    Past Surgical History:  Procedure Laterality Date   BIOPSY BREAST     BREAST CYST ASPIRATION Left 1990'S   BREAST EXCISIONAL BIOPSY Left 1990's   benign   CHOLECYSTECTOMY     Patient Active Problem List   Diagnosis Date Noted   Aphasia 08/05/2020   Leg pain 08/05/2020   Hyperlipidemia 08/05/2020    ONSET DATE: 05/07/2020; date of referral  12/21/2022  REFERRING DIAG: G 31.84 Mild Cognitive Impairment  THERAPY DIAG:  Aphasia  Cognitive communication deficit  Primary progressive aphasia (HCC)  Rationale for Evaluation and Treatment Rehabilitation  SUBJECTIVE:   SUBJECTIVE STATEMENT: Pt appears eager, "they say I have dementia but I just pray to my Lord and Saviour that I don't have it" Pt accompanied by: self  PERTINENT HISTORY: Pt is a right handed 67 year old female who initially exhibited concerns for semantic variant primary progressive aphasia during neuropsych testing (05/07/2020) for which she completed a brief course of Outpatient ST (6 sessions). Per chart " Mild Cognitive Impairment negative CSF for alzheimer's pathology (? Frontotemporal Dementia - primary language impairment and OCD behaviors), mildly elevated neurofilament light chain, neuropsych testing showed  Cognitive decreases are in executive function, language, memory. Affective and behavioral components are present in the form of mild disinhibition. Dementia Severity Scale Score: 11"   DIAGNOSTIC FINDINGS:   August 11/03/2022 Western Aphasia Battery-Revised (WAB-R): Part 1  Spontaneous Speech:  Informational Content: 9/10 Fluency, Grammatical Content, and Paraphasias: 9/10 (Some hesitations and word finding difficulty)  Auditory Verbal Comprehension:  Yes/No Questions: 60/60 Auditory Word Recognition: 53/60 Sequential Commands: 76/80  Repetition:  Single Words, Phrases, Sentences: 90/100  Naming and Word Finding: Object Naming: 27/60 Word Fluency: 4/20 Sentence Completion: 10/10 Responsive Speech: 9/10  Error types: anomia and semantic paraphasias Cues given: tactile, semantic, and phonemic  Aphasia Quotient: 82.9 Aphasia Severity: Mild (76 and above) Aphasia Type: Anomic    PAIN:  Are you having pain? No  FALLS: Has patient fallen in last 6 months?  No  LIVING ENVIRONMENT: Lives with: lives with their spouse Lives in: House/apartment  PLOF:  Level of assistance: Needed assistance with ADLs, Needed assistance with IADLS Employment: Retired   PATIENT GOALS    "to figure out what is going on"   OBJECTIVE:  COGNITION: Overall cognitive status: Impaired Areas of impairment:  Oriented to person Oriented to place Oriented to time Attention: Impaired: Sustained Memory: Impaired: Immediate Working Short term Awareness: Impaired: Emergent and Anticipatory Executive function: Impaired: Impulse control, Problem solving, Organization, Error awareness, and Self-correction Behavior: Restless, Impulsive, and effusive Impaired Functional deficits: reports that her husband recently starting paying bills, reports  that she asks him the same questions "over and over"  AUDITORY COMPREHENSION: Overall auditory comprehension: Appears intact YES/NO questions: Appears  intact Following directions: Appears intact Conversation: Simple Interfering components: attention, awareness, and working memory Effective technique: repetition/stressing words   READING COMPREHENSION: Intact  EXPRESSION: verbal  VERBAL EXPRESSION: Overall verbal expression: Impaired: simple Level of generative/spontaneous verbalization: sentence Automatic speech: name: intact and social response: intact  Repetition: Appears intact Naming: Responsive: 26-50%, Confrontation: 26-50%, Convergent: 26-50%, and Divergent: 26-50% Pragmatics: Impaired: abnormal effect, interpretation of nonverbal communication, topic appropriateness, topic maintenance, turn taking, and effusive  Comments: hypervebal Interfering components: attention Non-verbal means of communication: N/A  WRITTEN EXPRESSION: Dominant hand: right  Written expression:  To be tested  MOTOR SPEECH: Overall motor speech: Appears intact Respiration: diaphragmatic/abdominal breathing Phonation: normal Resonance: WFL Articulation: Appears intact Intelligibility: Intelligible Motor planning: Appears intact  ORAL MOTOR EXAMINATION Facial : WFL Lingual: WFL Velum: WFL Mandible: WFL Cough: WFL Voice: WFL   STANDARDIZED ASSESSMENTS:  The [employer] Action Naming Assessment was used to compliment object naming as previously assessed at [facility] using the Western Aphasia Battery (WAB).   The [employer] Action Naming Assessment (HAMA) is a useful to tool that can (1) identify action naming impairments and (2) be used to investigate the neural substrates underlying naming. It is a verb naming assessment that ocnsists of 30 black and white images of actions. The items are matched in frequency and length to the 30-item [employer] Cherre Huger et.al., 1610) A score of less than 12 likely semantic variant, score of 20+ likely non-fluent variant.   Breining, B. L., Gertie Gowda., Caffo, B., Algernon Huxley, [place]., ...  & Anette Riedel, A. E. (2021). Neural regions underlying object and action naming: complementary evidence from acute stroke and primary progressive aphasia. Aphasiology, 1-29.  Pt achieved a score of 5 out of 30 indicating likely semantic variant.    PATIENT REPORTED OUTCOME MEASURES (PROM): To be completed within next 3 sessions or whenever pt's husband can come in to session d/t pt's    TODAY'S TREATMENT:  N/A   PATIENT EDUCATION: Education details: results of this assessment, ST POC Person educated: Patient Education method: Explanation Education comprehension: needs further education  HOME EXERCISE PROGRAM:   N/A    GOALS:  Goals reviewed with patient? Yes  SHORT TERM GOALS: Target date: 10 sessions   To improve self-awareness, pt/family will a) identify at least one success and one challenge related to cognitive-communication abilities over the past week and b) identify at least one strategy to improve performance on similar tasks in the future   Baseline: Goal status: INITIAL  2.   To improve communication confidence, pt will (a) create and (b) demonstrate use of a verbal communication disclosure statement given guided questioning   Baseline:  Goal status: INITIAL  LONG TERM GOALS: Target date: 03/16/2023  To improve use of circumlocution strategy, pt will provide >4 pertinent details for 5/5 target words during Semantic Feature Analysis activity, given direct questioning by SLP or family.   Baseline:  Goal status: INITIAL  2.  Patient and family will be able to state 3 services that are available locally to offer support for dementia services.  Baseline:  Goal status: INITIAL  3.  With Min A, patient/family will demonstrate understanding of the following concepts: dementia, primary progressive aphasia,  communication vs conversation, strengths/strategies to promote success, local resources by answering multiple choice questions with 80% accuracy when provided  supported conversation in  order to increase patient's participation in medical care.  Baseline:  Goal status: INITIAL   ASSESSMENT:  CLINICAL IMPRESSION: Patient is a 67 y.o. female who was seen today for a cognitive communication evaluation. 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 Her communication is c/b fluent hyperverbal speech that is often vague containing "this, that, you know," frequently fluctuating to topics that she is familiar with which in turn results in off-topic communication. 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 Her deficits are with word finding (naming), verbal fluency, attention, memory and executive functioning as well as pragmatics with deficits observed in interpreting nonverbal communication, observing personal space and an overall euphoric response to therapy out of proportion to current deficits as well as moderate disinhibition. Despite the progressive nature of the patient's diagnosis, patient will benefit from skilled speech pathology services to compensate for the identified deficits in order to maximize safety, prevent further decline, maintain independence, and maximize quality of life.   OBJECTIVE IMPAIRMENTS include attention, memory, awareness, executive functioning, and expressive language. These impairments are limiting patient from managing medications, managing appointments, managing finances, household responsibilities, ADLs/IADLs, and effectively communicating at home and in community. Factors affecting potential to achieve goals and functional outcome are ability to learn/carryover information, co-morbidities, medical prognosis, previous level of function, and severity of impairments. Patient will benefit from skilled SLP services to address above impairments and improve overall function.  REHAB POTENTIAL:  Good  PLAN: SLP FREQUENCY: 1-2x/week  SLP DURATION: 12 weeks  PLANNED INTERVENTIONS: Language facilitation, Cueing hierachy, Internal/external aids, Functional tasks, Multimodal communication approach, SLP instruction and feedback, Compensatory strategies, and Patient/family education    Din Bookwalter B. Dreama Saa, M.S., CCC-SLP, Tree surgeon Certified Brain Injury Specialist [facility]  [facility] Rehabilitation Services Office [phone] Ascom [phone] Fax [phone]

## 2022-12-28 ENCOUNTER — Ambulatory Visit: Payer: Medicare HMO | Admitting: Speech Pathology

## 2022-12-28 DIAGNOSIS — R4701 Aphasia: Secondary | ICD-10-CM | POA: Diagnosis not present

## 2022-12-28 DIAGNOSIS — R41841 Cognitive communication deficit: Secondary | ICD-10-CM

## 2022-12-28 DIAGNOSIS — F0280 Dementia in other diseases classified elsewhere without behavioral disturbance: Secondary | ICD-10-CM

## 2022-12-28 NOTE — Therapy (Unsigned)
OUTPATIENT SPEECH LANGUAGE PATHOLOGY TREATMENT   Patient Name: Susan Patterson MRN: [id number] DOB:[birth year], 67 y.o., female Today's Date: 12/28/2022  PCP: Maurine Minister, MD REFERRING PROVIDER: Janice Coffin, PA   End of Session - 12/28/22 1325     Visit Number 2    Number of Visits 25    Date for SLP Re-Evaluation 03/16/23    Authorization Type [facility] Health PPO/[employer] Medicare    Authorization Time Period 12/22/2022 thru 03/16/2023    Authorization - Visit Number 2    Authorization - Number of Visits 25    Progress Note Due on Visit 10    SLP Start Time 1145    SLP Stop Time  1217    SLP Time Calculation (min) 32 min    Activity Tolerance Patient tolerated treatment well             Past Medical History:  Diagnosis Date   Heart murmur    Hyperlipidemia    Mitral valve prolapse    Past Surgical History:  Procedure Laterality Date   BIOPSY BREAST     BREAST CYST ASPIRATION Left 1990'S   BREAST EXCISIONAL BIOPSY Left 1990's   benign   CHOLECYSTECTOMY     Patient Active Problem List   Diagnosis Date Noted   Aphasia 08/05/2020   Leg pain 08/05/2020   Hyperlipidemia 08/05/2020    ONSET DATE: 05/07/2020; date of referral  12/21/2022  REFERRING DIAG: G 31.84 Mild Cognitive Impairment  THERAPY DIAG:  Aphasia  Cognitive communication deficit  Primary progressive aphasia (HCC)  Rationale for Evaluation and Treatment Rehabilitation  SUBJECTIVE:   PERTINENT HISTORY: Pt is a right handed 67 year old female who initially exhibited concerns for semantic variant primary progressive aphasia during neuropsych testing (05/07/2020) for which she completed a brief course of Outpatient ST (6 sessions). Per chart " Mild Cognitive Impairment negative CSF for alzheimer's pathology (? 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 Affective and behavioral components are present in the form of mild disinhibition. Dementia Severity Scale Score: 11"   DIAGNOSTIC FINDINGS:   August 11/03/2022 Western Aphasia Battery-Revised (WAB-R): Part 1  Spontaneous Speech:  Informational Content: 9/10 Fluency, Grammatical Content, and Paraphasias: 9/10 (Some hesitations and word finding difficulty)  Auditory Verbal Comprehension:  Yes/No Questions: 60/60 Auditory Word Recognition: 53/60 Sequential Commands: 76/80  Repetition:  Single Words, Phrases, Sentences: 90/100  Naming and Word Finding: Object Naming: 27/60 Word Fluency: 4/20 Sentence Completion: 10/10 Responsive Speech: 9/10  Error types: anomia and semantic paraphasias Cues given: tactile, semantic, and phonemic  Aphasia Quotient: 82.9 Aphasia Severity: Mild (76 and above) Aphasia Type: Anomic    PAIN:  Are you having pain? No  FALLS: Has patient fallen in last 6 months?  No  LIVING ENVIRONMENT: Lives with: lives with their spouse Lives in: House/apartment  PLOF:  Level of assistance: Needed assistance with ADLs, Needed assistance with IADLS Employment: Retired   PATIENT GOALS    "to figure out what is going on"  SUBJECTIVE STATEMENT: "I can only stay 30 minutes" Pt accompanied by: self  OBJECTIVE:    PATIENT REPORTED OUTCOME MEASURES (PROM): To be completed within next 3 sessions or whenever pt's husband can come in to session d/t pt's    TODAY'S TREATMENT:  Skilled treatment session focused on pt's cognitive communication goals. SLP facilitated session by providing the following interventions:  SLP provided picture from  Western Aphasia Battery as writing prompt and asked pt to use complete sentences to generate a written description of picture. Pt wrote a total of 7 sentences each one was agrammatical lacking verbs. With Min A, pt able to generate additional sentences about pictures that were  grammatical using appropriate subject-verb agreement and improved adjectives. Pt with difficulty spelling and word finding during both compositions. While she was aware of struggling, she remains dismissive of deficits - for example "my children never made sand castles" when she was not able to retrieve the word "sand castles."  During cuing, pt demonstrated impulsivity as revealed by pt interrupting this Clinical research associate mid-sentence/explanation and making repetitive statements throughout the session sevarl minutes apart. Pt with no awareness that she was being repetitive.    PATIENT EDUCATION: Education details: see above Person educated: Patient Education method: Explanation Education comprehension: needs further education  HOME EXERCISE PROGRAM:   N/A    GOALS:  Goals reviewed with patient? Yes  SHORT TERM GOALS: Target date: 10 sessions   To improve self-awareness, pt/family will a) identify at least one success and one challenge related to cognitive-communication abilities over the past week and b) identify at least one strategy to improve performance on similar tasks in the future   Baseline: Goal status: INITIAL  2.   To improve communication confidence, pt will (a) create and (b) demonstrate use of a verbal communication disclosure statement given guided questioning   Baseline:  Goal status: INITIAL  LONG TERM GOALS: Target date: 03/16/2023  To improve use of circumlocution strategy, pt will provide >4 pertinent details for 5/5 target words during Semantic Feature Analysis activity, given direct questioning by SLP or family.   Baseline:  Goal status: INITIAL  2.  Patient and family will be able to state 3 services that are available locally to offer support for dementia services.  Baseline:  Goal status: INITIAL  3.  With Min A, patient/family will demonstrate understanding of the following concepts: dementia, primary progressive aphasia,  communication vs conversation,  strengths/strategies to promote success, local resources by answering multiple choice questions with 80% accuracy when provided supported conversation in order to increase patient's participation in medical care.  Baseline:  Goal status: INITIAL   ASSESSMENT:  CLINICAL IMPRESSION: Patient is a 67 y.o. female who was seen today for a cognitive communication treatment. 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 Her communication is c/b fluent hyperverbal speech that is often vague containing "this, that, you know," frequently fluctuating to topics that she is familiar with which in turn results in off-topic communication. 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 Her deficits are with word finding (naming), verbal fluency, attention, memory and executive functioning as well as pragmatics with deficits observed in interpreting nonverbal communication, observing personal space and an overall euphoric response to therapy out of proportion to current deficits as well as moderate disinhibition.   During today's session, pt frequently interrupted this Clinical research associate during attempts to cue her reducing her ability to perform task. See detailed treatment note.   Despite the progressive nature of the patient's diagnosis, patient will benefit from skilled speech pathology services to compensate for the identified deficits in order to maximize safety, prevent further decline, maintain independence, and maximize quality of life.   OBJECTIVE IMPAIRMENTS include attention, memory, awareness, executive functioning, and expressive language. These impairments are limiting patient from managing medications, managing appointments, managing finances, household responsibilities, ADLs/IADLs, and  effectively communicating at home and in community. Factors affecting potential to achieve goals and  functional outcome are ability to learn/carryover information, co-morbidities, medical prognosis, previous level of function, and severity of impairments. Patient will benefit from skilled SLP services to address above impairments and improve overall function.  REHAB POTENTIAL: Good  PLAN: SLP FREQUENCY: 1-2x/week  SLP DURATION: 12 weeks  PLANNED INTERVENTIONS: Language facilitation, Cueing hierachy, Internal/external aids, Functional tasks, Multimodal communication approach, SLP instruction and feedback, Compensatory strategies, and Patient/family education    Qaadir Kent B. Dreama Saa, M.S., CCC-SLP, Tree surgeon Certified Brain Injury Specialist [facility]  [facility] Rehabilitation Services Office [phone] Ascom [phone] Fax [phone]

## 2022-12-30 ENCOUNTER — Ambulatory Visit: Payer: Medicare HMO | Admitting: Speech Pathology

## 2022-12-30 DIAGNOSIS — R4701 Aphasia: Secondary | ICD-10-CM | POA: Diagnosis not present

## 2022-12-30 DIAGNOSIS — F0280 Dementia in other diseases classified elsewhere without behavioral disturbance: Secondary | ICD-10-CM

## 2022-12-30 DIAGNOSIS — R41841 Cognitive communication deficit: Secondary | ICD-10-CM

## 2022-12-30 NOTE — Therapy (Signed)
OUTPATIENT SPEECH LANGUAGE PATHOLOGY TREATMENT   Patient Name: Susan Patterson MRN: [id number] DOB:10/17/1955, 67 y.o., female Today's Date: 12/30/2022  PCP: Maurine Minister, MD REFERRING PROVIDER: Janice Coffin, PA   End of Session - 12/30/22 0847     Visit Number 3    Number of Visits 25    Date for SLP Re-Evaluation 03/16/23    Authorization Type [facility] Health PPO/[employer] Medicare    Authorization Time Period 12/22/2022 thru 03/16/2023    Authorization - Visit Number 3    Authorization - Number of Visits 25    Progress Note Due on Visit 10    SLP Start Time 0845    SLP Stop Time  0930    SLP Time Calculation (min) 45 min    Activity Tolerance Patient tolerated treatment well             Past Medical History:  Diagnosis Date   Heart murmur    Hyperlipidemia    Mitral valve prolapse    Past Surgical History:  Procedure Laterality Date   BIOPSY BREAST     BREAST CYST ASPIRATION Left 1990'S   BREAST EXCISIONAL BIOPSY Left 1990's   benign   CHOLECYSTECTOMY     Patient Active Problem List   Diagnosis Date Noted   Aphasia 08/05/2020   Leg pain 08/05/2020   Hyperlipidemia 08/05/2020    ONSET DATE: 05/07/2020; date of referral  12/21/2022  REFERRING DIAG: G 31.84 Mild Cognitive Impairment  THERAPY DIAG:  Aphasia  Cognitive communication deficit  Primary progressive aphasia (HCC)  Rationale for Evaluation and Treatment Rehabilitation  SUBJECTIVE:   PERTINENT HISTORY: Pt is a right handed 67 year old female who initially exhibited concerns for semantic variant primary progressive aphasia during neuropsych testing (05/07/2020) for which she completed a brief course of Outpatient ST (6 sessions). Per chart " Mild Cognitive Impairment negative CSF for alzheimer's pathology (? 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 Affective and behavioral components are present in the form of mild disinhibition. Dementia Severity Scale Score: 11"   DIAGNOSTIC FINDINGS:   August 11/03/2022 Western Aphasia Battery-Revised (WAB-R): Part 1  Spontaneous Speech:  Informational Content: 9/10 Fluency, Grammatical Content, and Paraphasias: 9/10 (Some hesitations and word finding difficulty)  Auditory Verbal Comprehension:  Yes/No Questions: 60/60 Auditory Word Recognition: 53/60 Sequential Commands: 76/80  Repetition:  Single Words, Phrases, Sentences: 90/100  Naming and Word Finding: Object Naming: 27/60 Word Fluency: 4/20 Sentence Completion: 10/10 Responsive Speech: 9/10  Error types: anomia and semantic paraphasias Cues given: tactile, semantic, and phonemic  Aphasia Quotient: 82.9 Aphasia Severity: Mild (76 and above) Aphasia Type: Anomic    PAIN:  Are you having pain? No  FALLS: Has patient fallen in last 6 months?  No  LIVING ENVIRONMENT: Lives with: lives with their spouse Lives in: House/apartment  PLOF:  Level of assistance: Needed assistance with ADLs, Needed assistance with IADLS Employment: Retired   PATIENT GOALS    "to figure out what is going on"  SUBJECTIVE STATEMENT: "I asked [name] if he wanted to come and he said no" he working today Pt accompanied by: self  OBJECTIVE:   TODAY'S TREATMENT:  Skilled treatment session focused on pt's cognitive communication goals. SLP facilitated session by providing the following interventions:  Pt arrived to session by herself and mentions that she will continue to invite her husband to sessions if he isn't working.  SLP facilitated session by administered the following PROM. It should be noted that pt lack implicit awareness therefore she answers might be inflated.    PATIENT REPORTED OUTCOME MEASURES (PROM):   The Communication Effectiveness Survey is a patient-reported outcome measure in which the  patient rates their own effectiveness in different communication situations. A higher score indicates greater effectiveness.   Pt's self-rating was 31/32.   Having a conversation with a family member or friends at home. 4- Very Effective Participating in conversation with strangers in a quiet place. 4- Very Effective Conversing with a familiar person over the telephone. 4- Very Effective Conversing with a stranger over the telephone. 4- Very Effective Being part of a conversation in a noisy environment (social gathering). 3 Speaking to a friend when you are emotionally upset or you are angry. 4- Very Effective Having a conversation while traveling in a car. 4- Very Effective Having a conversation with someone at a distance (across a room). 4- Very Effective  Throughout the session, pt with repetitive remarks even minutes apart for which she doesn't appear to recall making same statements.    PATIENT EDUCATION: Education details: see above Person educated: Patient Education method: Explanation Education comprehension: needs further education  HOME EXERCISE PROGRAM:   N/A    GOALS:  Goals reviewed with patient? Yes  SHORT TERM GOALS: Target date: 10 sessions   To improve self-awareness, pt/family will a) identify at least one success and one challenge related to cognitive-communication abilities over the past week and b) identify at least one strategy to improve performance on similar tasks in the future   Baseline: Goal status: INITIAL  2.   To improve communication confidence, pt will (a) create and (b) demonstrate use of a verbal communication disclosure statement given guided questioning   Baseline:  Goal status: INITIAL  LONG TERM GOALS: Target date: 03/16/2023  To improve use of circumlocution strategy, pt will provide >4 pertinent details for 5/5 target words during Semantic Feature Analysis activity, given direct questioning by SLP or family.   Baseline:  Goal  status: INITIAL  2.  Patient and family will be able to state 3 services that are available locally to offer support for dementia services.  Baseline:  Goal status: INITIAL  3.  With Min A, patient/family will demonstrate understanding of the following concepts: dementia, primary progressive aphasia,  communication vs conversation, strengths/strategies to promote success, local resources by answering multiple choice questions with 80% accuracy when provided supported conversation in order to increase patient's participation in medical care.  Baseline:  Goal status: INITIAL   ASSESSMENT:  CLINICAL IMPRESSION: Patient is a 67 y.o. female who was seen today for a cognitive communication treatment. 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 Her communication is c/b fluent hyperverbal speech that is often vague containing "this, that, you know," frequently fluctuating to topics that she is familiar with which in turn results in off-topic communication. 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 Her deficits are with word finding (naming), verbal fluency, attention, memory and executive functioning as well as pragmatics with deficits observed in interpreting nonverbal communication, observing personal space and an overall euphoric response to therapy out of proportion to current deficits as well as moderate disinhibition.   Pt continues to frequently interrupt this Clinical research associate during attempts to cue her reducing her ability to perform task. See detailed treatment note.   Despite the progressive nature of the  patient's diagnosis, patient will benefit from skilled speech pathology services to compensate for the identified deficits in order to maximize safety, prevent further decline, maintain independence, and maximize quality of life.   OBJECTIVE IMPAIRMENTS include attention,  memory, awareness, executive functioning, and expressive language. These impairments are limiting patient from managing medications, managing appointments, managing finances, household responsibilities, ADLs/IADLs, and effectively communicating at home and in community. Factors affecting potential to achieve goals and functional outcome are ability to learn/carryover information, co-morbidities, medical prognosis, previous level of function, and severity of impairments. Patient will benefit from skilled SLP services to address above impairments and improve overall function.  REHAB POTENTIAL: Good  PLAN: SLP FREQUENCY: 1-2x/week  SLP DURATION: 12 weeks  PLANNED INTERVENTIONS: Language facilitation, Cueing hierachy, Internal/external aids, Functional tasks, Multimodal communication approach, SLP instruction and feedback, Compensatory strategies, and Patient/family education    Camielle Sizer B. Dreama Saa, M.S., CCC-SLP, Tree surgeon Certified Brain Injury Specialist [facility]  [facility] Rehabilitation Services Office [phone] Ascom [phone] Fax [phone]

## 2023-01-03 ENCOUNTER — Ambulatory Visit: Payer: Medicare HMO | Admitting: Speech Pathology

## 2023-01-03 DIAGNOSIS — R4701 Aphasia: Secondary | ICD-10-CM

## 2023-01-03 DIAGNOSIS — F0280 Dementia in other diseases classified elsewhere without behavioral disturbance: Secondary | ICD-10-CM

## 2023-01-03 DIAGNOSIS — R41841 Cognitive communication deficit: Secondary | ICD-10-CM

## 2023-01-03 NOTE — Therapy (Signed)
OUTPATIENT SPEECH LANGUAGE PATHOLOGY TREATMENT   Patient Name: Susan Patterson MRN: [id number] DOB:Mar 18, 1955, 67 y.o., female Today's Date: 01/03/2023  PCP: Maurine Minister, MD REFERRING PROVIDER: Janice Coffin, PA   End of Session - 01/03/23 [id number]     Visit Number 4    Number of Visits 25    Date for SLP Re-Evaluation 03/16/23    Authorization Type [facility] Health PPO/[employer] Medicare    Authorization Time Period 12/22/2022 thru 03/16/2023    Authorization - Visit Number 4    Authorization - Number of Visits 25    Progress Note Due on Visit 10    SLP Start Time 0845    SLP Stop Time  0930    SLP Time Calculation (min) 45 min    Activity Tolerance Patient tolerated treatment well             Past Medical History:  Diagnosis Date   Heart murmur    Hyperlipidemia    Mitral valve prolapse    Past Surgical History:  Procedure Laterality Date   BIOPSY BREAST     BREAST CYST ASPIRATION Left 1990'S   BREAST EXCISIONAL BIOPSY Left 1990's   benign   CHOLECYSTECTOMY     Patient Active Problem List   Diagnosis Date Noted   Aphasia 08/05/2020   Leg pain 08/05/2020   Hyperlipidemia 08/05/2020    ONSET DATE: 05/07/2020; date of referral  12/21/2022  REFERRING DIAG: G 31.84 Mild Cognitive Impairment  THERAPY DIAG:  Cognitive communication deficit  Aphasia  Primary progressive aphasia (HCC)  Rationale for Evaluation and Treatment Rehabilitation  SUBJECTIVE:   PERTINENT HISTORY: Pt is a right handed 67 year old female who initially exhibited concerns for semantic variant primary progressive aphasia during neuropsych testing (05/07/2020) for which she completed a brief course of Outpatient ST (6 sessions). Per chart " Mild Cognitive Impairment negative CSF for alzheimer's pathology (? 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 Affective and behavioral components are present in the form of mild disinhibition. Dementia Severity Scale Score: 11"   DIAGNOSTIC FINDINGS:   August 11/03/2022 Western Aphasia Battery-Revised (WAB-R): Part 1  Spontaneous Speech:  Informational Content: 9/10 Fluency, Grammatical Content, and Paraphasias: 9/10 (Some hesitations and word finding difficulty)  Auditory Verbal Comprehension:  Yes/No Questions: 60/60 Auditory Word Recognition: 53/60 Sequential Commands: 76/80  Repetition:  Single Words, Phrases, Sentences: 90/100  Naming and Word Finding: Object Naming: 27/60 Word Fluency: 4/20 Sentence Completion: 10/10 Responsive Speech: 9/10  Error types: anomia and semantic paraphasias Cues given: tactile, semantic, and phonemic  Aphasia Quotient: 82.9 Aphasia Severity: Mild (76 and above) Aphasia Type: Anomic    PAIN:  Are you having pain? No  FALLS: Has patient fallen in last 6 months?  No  LIVING ENVIRONMENT: Lives with: lives with their spouse Lives in: House/apartment  PLOF:  Level of assistance: Needed assistance with ADLs, Needed assistance with IADLS Employment: Retired   PATIENT GOALS    "to figure out what is going on"  SUBJECTIVE STATEMENT: "How as your weekend, how as church?" Pt accompanied by: self  OBJECTIVE:   TODAY'S TREATMENT:  Skilled treatment session focused on pt's cognitive communication goals. SLP facilitated session by providing the following interventions:  Pt arrived to session reporting, "I have noticed that I can't remember words that I use to be able to"  SLP engaged pt in lexical retrieval treatment for 15  basic common objects - when pt unable to name 6 of 15 items, SLP prompted to write down the names of objects and she was able to do so in 1 of 6 opportunities after practice but then was able to write to spelling for the remaining 5 objects. Pt continues to be eager.    PATIENT  EDUCATION: Education details: see above Person educated: Patient Education method: Explanation Education comprehension: needs further education  HOME EXERCISE PROGRAM:   Practice writing names of common objects  Reading out loud    GOALS:  Goals reviewed with patient? Yes  SHORT TERM GOALS: Target date: 10 sessions   To improve self-awareness, pt/family will a) identify at least one success and one challenge related to cognitive-communication abilities over the past week and b) identify at least one strategy to improve performance on similar tasks in the future   Baseline: Goal status: INITIAL  2.   To improve communication confidence, pt will (a) create and (b) demonstrate use of a verbal communication disclosure statement given guided questioning   Baseline:  Goal status: INITIAL  LONG TERM GOALS: Target date: 03/16/2023  To improve use of circumlocution strategy, pt will provide >4 pertinent details for 5/5 target words during Semantic Feature Analysis activity, given direct questioning by SLP or family.   Baseline:  Goal status: INITIAL  2.  Patient and family will be able to state 3 services that are available locally to offer support for dementia services.  Baseline:  Goal status: INITIAL  3.  With Min A, patient/family will demonstrate understanding of the following concepts: dementia, primary progressive aphasia,  communication vs conversation, strengths/strategies to promote success, local resources by answering multiple choice questions with 80% accuracy when provided supported conversation in order to increase patient's participation in medical care.  Baseline:  Goal status: INITIAL   ASSESSMENT:  CLINICAL IMPRESSION: Patient is a 67 y.o. female who was seen today for a cognitive communication treatment. 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 Her communication is c/b fluent hyperverbal speech that is often  vague containing "this, that, you know," frequently fluctuating to topics that she is familiar with which in turn results in off-topic communication. 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 Her deficits are with word finding (naming), verbal fluency, attention, memory and executive functioning as well as pragmatics with deficits observed in interpreting nonverbal communication, observing personal space and an overall euphoric response to therapy out of proportion to current deficits as well as moderate disinhibition.   Pt with increased "on topic" conversation and engagement in task with increased naming.   OBJECTIVE IMPAIRMENTS include attention, memory, awareness, executive functioning, and expressive language. These impairments are limiting patient from managing medications, managing appointments, managing finances, household responsibilities, ADLs/IADLs, and effectively communicating at home and in community. Factors affecting potential to achieve goals and functional outcome are ability to learn/carryover information, co-morbidities, medical prognosis, previous level of function, and severity of impairments. Patient will benefit from skilled SLP services to address above impairments and improve overall function.  REHAB POTENTIAL: Good  PLAN: SLP FREQUENCY: 1-2x/week  SLP DURATION: 12 weeks  PLANNED INTERVENTIONS: Language facilitation, Cueing hierachy, Internal/external aids, Functional tasks, Multimodal communication approach, SLP instruction and feedback, Compensatory strategies, and Patient/family education    Chelesea Weiand B. Dreama Saa, M.S., CCC-SLP, Tree surgeon Certified Brain Injury Specialist [facility]  [facility] Rehabilitation Services Office [phone] Ascom [phone] Fax [phone]

## 2023-01-05 ENCOUNTER — Ambulatory Visit
Admission: RE | Admit: 2023-01-05 | Discharge: 2023-01-05 | Disposition: A | Discharge status: Home / Self Care | Payer: Medicare HMO | Source: Ambulatory Visit | Attending: Physician Assistant | Admitting: Physician Assistant

## 2023-01-05 ENCOUNTER — Ambulatory Visit: Payer: Medicare HMO | Admitting: Speech Pathology

## 2023-01-05 DIAGNOSIS — F0280 Dementia in other diseases classified elsewhere without behavioral disturbance: Secondary | ICD-10-CM

## 2023-01-05 DIAGNOSIS — Z1231 Encounter for screening mammogram for malignant neoplasm of breast: Secondary | ICD-10-CM | POA: Diagnosis present

## 2023-01-05 DIAGNOSIS — R41841 Cognitive communication deficit: Secondary | ICD-10-CM

## 2023-01-05 DIAGNOSIS — R4701 Aphasia: Secondary | ICD-10-CM | POA: Diagnosis not present

## 2023-01-05 NOTE — Therapy (Unsigned)
OUTPATIENT SPEECH LANGUAGE PATHOLOGY TREATMENT   Patient Name: Susan Patterson MRN: [id number] DOB:1955-11-12, 67 y.o., female Today's Date: 01/05/2023  PCP: Maurine Minister, MD REFERRING PROVIDER: Janice Coffin, PA   End of Session - 01/05/23 1357     Visit Number 5    Number of Visits 25    Date for SLP Re-Evaluation 03/16/23    Authorization Type [facility] Health PPO/[employer] Medicare    Authorization Time Period 12/22/2022 thru 03/16/2023    Authorization - Visit Number 5    Authorization - Number of Visits 25    Progress Note Due on Visit 10    SLP Start Time 0930    SLP Stop Time  1015    SLP Time Calculation (min) 45 min    Activity Tolerance Patient tolerated treatment well             Past Medical History:  Diagnosis Date   Heart murmur    Hyperlipidemia    Mitral valve prolapse    Past Surgical History:  Procedure Laterality Date   BIOPSY BREAST     BREAST CYST ASPIRATION Left 1990'S   BREAST EXCISIONAL BIOPSY Left 1990's   benign   CHOLECYSTECTOMY     Patient Active Problem List   Diagnosis Date Noted   Aphasia 08/05/2020   Leg pain 08/05/2020   Hyperlipidemia 08/05/2020    ONSET DATE: 05/07/2020; date of referral  12/21/2022  REFERRING DIAG: G 31.84 Mild Cognitive Impairment  THERAPY DIAG:  Cognitive communication deficit  Aphasia  Primary progressive aphasia (HCC)  Rationale for Evaluation and Treatment Rehabilitation  SUBJECTIVE:   PERTINENT HISTORY: Pt is a right handed 66 year old female who initially exhibited concerns for semantic variant primary progressive aphasia during neuropsych testing (05/07/2020) for which she completed a brief course of Outpatient ST (6 sessions). Per chart " Mild Cognitive Impairment negative CSF for alzheimer's pathology (? 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 Affective and behavioral components are present in the form of mild disinhibition. Dementia Severity Scale Score: 11"   DIAGNOSTIC FINDINGS:   August 11/03/2022 Western Aphasia Battery-Revised (WAB-R): Part 1  Spontaneous Speech:  Informational Content: 9/10 Fluency, Grammatical Content, and Paraphasias: 9/10 (Some hesitations and word finding difficulty)  Auditory Verbal Comprehension:  Yes/No Questions: 60/60 Auditory Word Recognition: 53/60 Sequential Commands: 76/80  Repetition:  Single Words, Phrases, Sentences: 90/100  Naming and Word Finding: Object Naming: 27/60 Word Fluency: 4/20 Sentence Completion: 10/10 Responsive Speech: 9/10  Error types: anomia and semantic paraphasias Cues given: tactile, semantic, and phonemic  Aphasia Quotient: 82.9 Aphasia Severity: Mild (76 and above) Aphasia Type: Anomic    PAIN:  Are you having pain? No  FALLS: Has patient fallen in last 6 months?  No  LIVING ENVIRONMENT: Lives with: lives with their spouse Lives in: House/apartment  PLOF:  Level of assistance: Needed assistance with ADLs, Needed assistance with IADLS Employment: Retired   PATIENT GOALS    "to figure out what is going on"  SUBJECTIVE STATEMENT: "I brought some books for you" Pt accompanied by: self  OBJECTIVE:   TODAY'S TREATMENT:  Skilled treatment session focused on pt's cognitive communication goals. SLP facilitated session by providing the following interventions:  While pt continues to say "I don't ever think about things like that" (referring to some common objects such as a stapler), she does report increased insight in comments such as "  I have noticed that I think something but I don't say the right word all the time, I have noticed that" In addition, when SLP presented specific items for pt such as earrings, necklace pt did have insight as she mentioned "I know those things but forgot how to say  them"  SLP engaged pt in lexical retrieval treatment for 31 basic common objects - when pt unable to name 15 of 31 items, SLP prompted to write down the names of objects and she was able to do so in 20 of 31 opportunities after practice but then was able to write to spelling for the remaining 15 objects. Pt continues to be eager.    PATIENT EDUCATION: Education details: see above Person educated: Patient Education method: Explanation Education comprehension: needs further education  HOME EXERCISE PROGRAM:   Practice writing names of common objects  Reading out loud    GOALS:  Goals reviewed with patient? Yes  SHORT TERM GOALS: Target date: 10 sessions   To improve self-awareness, pt/family will a) identify at least one success and one challenge related to cognitive-communication abilities over the past week and b) identify at least one strategy to improve performance on similar tasks in the future   Baseline: Goal status: INITIAL  2.   To improve communication confidence, pt will (a) create and (b) demonstrate use of a verbal communication disclosure statement given guided questioning   Baseline:  Goal status: INITIAL  LONG TERM GOALS: Target date: 03/16/2023  To improve use of circumlocution strategy, pt will provide >4 pertinent details for 5/5 target words during Semantic Feature Analysis activity, given direct questioning by SLP or family.   Baseline:  Goal status: INITIAL  2.  Patient and family will be able to state 3 services that are available locally to offer support for dementia services.  Baseline:  Goal status: INITIAL  3.  With Min A, patient/family will demonstrate understanding of the following concepts: dementia, primary progressive aphasia,  communication vs conversation, strengths/strategies to promote success, local resources by answering multiple choice questions with 80% accuracy when provided supported conversation in order to increase patient's  participation in medical care.  Baseline:  Goal status: INITIAL   ASSESSMENT:  CLINICAL IMPRESSION: Patient is a 67 y.o. female who was seen today for a cognitive communication treatment. 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 Her communication is c/b fluent hyperverbal speech that is often vague containing "this, that, you know," frequently fluctuating to topics that she is familiar with which in turn results in off-topic communication. 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 Her deficits are with word finding (naming), verbal fluency, attention, memory and executive functioning as well as pragmatics with deficits observed in interpreting nonverbal communication, observing personal space and an overall euphoric response to therapy out of proportion to current deficits as well as moderate disinhibition.   Pt with increased "on topic" conversation and engagement in task with increased naming as well as increased insightful comments throughout session. See the above skilled treatment note for details.   OBJECTIVE IMPAIRMENTS include attention, memory, awareness, executive functioning, and expressive language. These impairments are limiting patient from managing medications, managing appointments, managing finances, household responsibilities, ADLs/IADLs, and effectively communicating at home and in community. Factors affecting potential to achieve goals and functional outcome are ability to learn/carryover information, co-morbidities, medical prognosis, previous level of function, and severity of impairments. Patient will benefit from skilled  SLP services to address above impairments and improve overall function.  REHAB POTENTIAL: Good  PLAN: SLP FREQUENCY: 1-2x/week  SLP DURATION: 12 weeks  PLANNED INTERVENTIONS: Language facilitation, Cueing hierachy,  Internal/external aids, Functional tasks, Multimodal communication approach, SLP instruction and feedback, Compensatory strategies, and Patient/family education    Loyed Wilmes B. Dreama Saa, M.S., CCC-SLP, Tree surgeon Certified Brain Injury Specialist [facility]  [facility] Rehabilitation Services Office [phone] Ascom [phone] Fax [phone]

## 2023-01-10 ENCOUNTER — Ambulatory Visit: Payer: Medicare HMO | Attending: Student | Admitting: Speech Pathology

## 2023-01-10 DIAGNOSIS — G3101 Pick's disease: Secondary | ICD-10-CM | POA: Insufficient documentation

## 2023-01-10 DIAGNOSIS — R4701 Aphasia: Secondary | ICD-10-CM | POA: Diagnosis present

## 2023-01-10 DIAGNOSIS — F0280 Dementia in other diseases classified elsewhere without behavioral disturbance: Secondary | ICD-10-CM | POA: Insufficient documentation

## 2023-01-10 DIAGNOSIS — R41841 Cognitive communication deficit: Secondary | ICD-10-CM | POA: Insufficient documentation

## 2023-01-10 NOTE — Therapy (Signed)
OUTPATIENT SPEECH LANGUAGE PATHOLOGY TREATMENT   Patient Name: Susan Patterson MRN: [id number] DOB:January 09, 1956, 67 y.o., female Today's Date: 01/10/2023  PCP: Maurine Minister, MD REFERRING PROVIDER: Janice Coffin, PA   End of Session - 01/10/23 0841     Visit Number 6    Number of Visits 25    Date for SLP Re-Evaluation 03/16/23    Authorization Type [facility] Health PPO/[employer] Medicare    Authorization Time Period 12/22/2022 thru 03/16/2023    Authorization - Visit Number 6    Authorization - Number of Visits 25    Progress Note Due on Visit 10    SLP Start Time 0845    SLP Stop Time  0930    SLP Time Calculation (min) 45 min    Activity Tolerance Patient tolerated treatment well             Past Medical History:  Diagnosis Date   Heart murmur    Hyperlipidemia    Mitral valve prolapse    Past Surgical History:  Procedure Laterality Date   BIOPSY BREAST     BREAST CYST ASPIRATION Left 1990'S   BREAST EXCISIONAL BIOPSY Left 1990's   benign   CHOLECYSTECTOMY     Patient Active Problem List   Diagnosis Date Noted   Aphasia 08/05/2020   Leg pain 08/05/2020   Hyperlipidemia 08/05/2020    ONSET DATE: 05/07/2020; date of referral  12/21/2022  REFERRING DIAG: G 31.84 Mild Cognitive Impairment  THERAPY DIAG:  Aphasia  Cognitive communication deficit  Primary progressive aphasia (HCC)  Rationale for Evaluation and Treatment Rehabilitation  SUBJECTIVE:   PERTINENT HISTORY: Pt is a right handed 67 year old female who initially exhibited concerns for semantic variant primary progressive aphasia during neuropsych testing (05/07/2020) for which she completed a brief course of Outpatient ST (6 sessions). Per chart " Mild Cognitive Impairment negative CSF for alzheimer's pathology (? 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 Affective and behavioral components are present in the form of mild disinhibition. Dementia Severity Scale Score: 11"   DIAGNOSTIC FINDINGS:   August 11/03/2022 Western Aphasia Battery-Revised (WAB-R): Part 1  Spontaneous Speech:  Informational Content: 9/10 Fluency, Grammatical Content, and Paraphasias: 9/10 (Some hesitations and word finding difficulty)  Auditory Verbal Comprehension:  Yes/No Questions: 60/60 Auditory Word Recognition: 53/60 Sequential Commands: 76/80  Repetition:  Single Words, Phrases, Sentences: 90/100  Naming and Word Finding: Object Naming: 27/60 Word Fluency: 4/20 Sentence Completion: 10/10 Responsive Speech: 9/10  Error types: anomia and semantic paraphasias Cues given: tactile, semantic, and phonemic  Aphasia Quotient: 82.9 Aphasia Severity: Mild (76 and above) Aphasia Type: Anomic    PAIN:  Are you having pain? No  FALLS: Has patient fallen in last 6 months?  No  LIVING ENVIRONMENT: Lives with: lives with their spouse Lives in: House/apartment  PLOF:  Level of assistance: Needed assistance with ADLs, Needed assistance with IADLS Employment: Retired   PATIENT GOALS    "to figure out what is going on"  SUBJECTIVE STATEMENT: "Sometimes I think this (pointing to her head) but I say this (pointing to her mouth) Pt accompanied by: self  OBJECTIVE:   TODAY'S TREATMENT:  Skilled treatment session focused on pt's cognitive communication goals. SLP facilitated session by providing the following interventions:  "Yesterday that were a few other things that I said the wrong thing but I don't remember what it was" Pt  self-corrected x 2 with increased awareness "see I just did it right then"  SLP engaged pt in lexical retrieval treatment for 20 basic common objects - when pt unable to name 5 of 20 items, SLP prompted to write down the names of objects and she was able to do so in 3 of 5 opportunities after  practice but then was able to write to spelling 11 out of 20 objects correctly. Pt continues to be eager.    PATIENT EDUCATION: Education details: see above Person educated: Patient Education method: Explanation Education comprehension: needs further education  HOME EXERCISE PROGRAM:   Practice writing names of common objects  Reading out loud    GOALS:  Goals reviewed with patient? Yes  SHORT TERM GOALS: Target date: 10 sessions   To improve self-awareness, pt/family will a) identify at least one success and one challenge related to cognitive-communication abilities over the past week and b) identify at least one strategy to improve performance on similar tasks in the future   Baseline: Goal status: INITIAL  2.   To improve communication confidence, pt will (a) create and (b) demonstrate use of a verbal communication disclosure statement given guided questioning   Baseline:  Goal status: INITIAL  LONG TERM GOALS: Target date: 03/16/2023  To improve use of circumlocution strategy, pt will provide >4 pertinent details for 5/5 target words during Semantic Feature Analysis activity, given direct questioning by SLP or family.   Baseline:  Goal status: INITIAL  2.  Patient and family will be able to state 3 services that are available locally to offer support for dementia services.  Baseline:  Goal status: INITIAL  3.  With Min A, patient/family will demonstrate understanding of the following concepts: dementia, primary progressive aphasia,  communication vs conversation, strengths/strategies to promote success, local resources by answering multiple choice questions with 80% accuracy when provided supported conversation in order to increase patient's participation in medical care.  Baseline:  Goal status: INITIAL   ASSESSMENT:  CLINICAL IMPRESSION: Patient is a 67 y.o. female who was seen today for a cognitive communication treatment. Pt presents with moderate cognitive  communication disorder d/t primary progressive aphasia. Her communication is c/b fluent hyperverbal speech that is often vague containing "this, that, you know," frequently fluctuating to topics that she is familiar with which in turn results in off-topic communication. 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 Her deficits are with word finding (naming), verbal fluency, attention, memory and executive functioning as well as pragmatics with deficits observed in interpreting nonverbal communication, observing personal space and an overall euphoric response to therapy out of proportion to current deficits as well as moderate disinhibition.   Pt continues to demonstrate increased awareness and insight into word finding deficits. She continues to practice naming objects at home.  See the above skilled treatment note for details.   OBJECTIVE IMPAIRMENTS include attention, memory, awareness, executive functioning, and expressive language. These impairments are limiting patient from managing medications, managing appointments, managing finances, household responsibilities, ADLs/IADLs, and effectively communicating at home and in community. Factors affecting potential to achieve goals and functional outcome are ability to learn/carryover information, co-morbidities, medical prognosis, previous level of function, and severity of impairments. Patient will benefit from skilled SLP services to address above impairments and improve overall function.  REHAB POTENTIAL: Good  PLAN: SLP FREQUENCY: 1-2x/week  SLP DURATION: 12 weeks  PLANNED INTERVENTIONS: Language facilitation, Cueing hierachy, Internal/external aids, Functional tasks, Multimodal communication  approach, SLP instruction and feedback, Compensatory strategies, and Patient/family education    Kramer Hanrahan B. Dreama Saa, M.S., CCC-SLP, Tree surgeon Certified  Brain Injury Specialist [facility]  [facility] Rehabilitation Services Office [phone] Ascom [phone] Fax [phone]

## 2023-01-12 ENCOUNTER — Ambulatory Visit: Payer: Medicare HMO | Admitting: Speech Pathology

## 2023-01-12 DIAGNOSIS — R41841 Cognitive communication deficit: Secondary | ICD-10-CM

## 2023-01-12 DIAGNOSIS — R4701 Aphasia: Secondary | ICD-10-CM | POA: Diagnosis not present

## 2023-01-12 DIAGNOSIS — F0280 Dementia in other diseases classified elsewhere without behavioral disturbance: Secondary | ICD-10-CM

## 2023-01-12 NOTE — Therapy (Signed)
OUTPATIENT SPEECH LANGUAGE PATHOLOGY TREATMENT   Patient Name: Susan Patterson MRN: [id number] DOB:[birth year], 67 y.o., female Today's Date: 01/12/2023  PCP: Maurine Minister, MD REFERRING PROVIDER: Janice Coffin, PA   End of Session - 01/12/23 1142     Visit Number 7    Number of Visits 25    Date for SLP Re-Evaluation 03/16/23    Authorization Type [facility] PPO/[employer] Medicare    Authorization Time Period 12/22/2022 thru 03/16/2023    Authorization - Visit Number 7    Authorization - Number of Visits 25    Progress Note Due on Visit 10    SLP Start Time 1145    SLP Stop Time  1230    SLP Time Calculation (min) 45 min    Activity Tolerance Patient tolerated treatment well             Past Medical History:  Diagnosis Date   Heart murmur    Hyperlipidemia    Mitral valve prolapse    Past Surgical History:  Procedure Laterality Date   BIOPSY BREAST     BREAST CYST ASPIRATION Left 1990'S   BREAST EXCISIONAL BIOPSY Left 1990's   benign   CHOLECYSTECTOMY     Patient Active Problem List   Diagnosis Date Noted   Aphasia 08/05/2020   Leg pain 08/05/2020   Hyperlipidemia 08/05/2020    ONSET DATE: 05/07/2020; date of referral  12/21/2022  REFERRING DIAG: G 31.84 Mild Cognitive Impairment  THERAPY DIAG:  Aphasia  Primary progressive aphasia (HCC)  Cognitive communication deficit  Rationale for Evaluation and Treatment Rehabilitation  SUBJECTIVE:   PERTINENT HISTORY: Pt is a right handed 67 year old female who initially exhibited concerns for semantic variant primary progressive aphasia during neuropsych testing (05/07/2020) for which she completed a brief course of Outpatient ST (6 sessions). Per chart " Mild Cognitive Impairment negative CSF for alzheimer's pathology (? 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 Affective and behavioral components are present in the form of mild disinhibition. Dementia Severity Scale Score: 11"   DIAGNOSTIC FINDINGS:   August 11/03/2022 Western Aphasia Battery-Revised (WAB-R): Part 1  Spontaneous Speech:  Informational Content: 9/10 Fluency, Grammatical Content, and Paraphasias: 9/10 (Some hesitations and word finding difficulty)  Auditory Verbal Comprehension:  Yes/No Questions: 60/60 Auditory Word Recognition: 53/60 Sequential Commands: 76/80  Repetition:  Single Words, Phrases, Sentences: 90/100  Naming and Word Finding: Object Naming: 27/60 Word Fluency: 4/20 Sentence Completion: 10/10 Responsive Speech: 9/10  Error types: anomia and semantic paraphasias Cues given: tactile, semantic, and phonemic  Aphasia Quotient: 82.9 Aphasia Severity: Mild (76 and above) Aphasia Type: Anomic    PAIN:  Are you having pain? No  FALLS: Has patient fallen in last 6 months?  No  LIVING ENVIRONMENT: Lives with: lives with their spouse Lives in: House/apartment  PLOF:  Level of assistance: Needed assistance with ADLs, Needed assistance with IADLS Employment: Retired   PATIENT GOALS    "to figure out what is going on"  SUBJECTIVE STATEMENT: Pt had rescheduled today's appt and then rescheduled it for original time as her husband was able to attend session Pt accompanied by: self and her husband  OBJECTIVE:   TODAY'S TREATMENT:  Skilled treatment session focused on pt's cognitive communication goals. SLP facilitated session by providing the following interventions:  Pt's husband was able to attends today's session, therefore skilled verbal information was provided  on dx of Primary Progressive Aphasia, dementia, word finding and cognitive communication impairments in memory, attention and social abilities.   He provides that pt has decreased social awareness and they have developed cues to help pt cease  inappropriate conversational topics (pt is not able to recall this information), pt is more "obsessive about time, anxiety over being late," they have advanced directives in place d/t falling victim to scam, and he provides that pt is now more passive in conversation d/t inability to follow topic.   Education provided on difficulty pt might have advocating for herself in an emergency situation and continue to recommend pt attend driving class per neurology recommendation.    PATIENT EDUCATION: Education details: see above Person educated: Patient Education method: Explanation Education comprehension: needs further education  HOME EXERCISE PROGRAM:   Practice writing names of common objects  Reading out loud    GOALS:  Goals reviewed with patient? Yes  SHORT TERM GOALS: Target date: 10 sessions   To improve self-awareness, pt/family will a) identify at least one success and one challenge related to cognitive-communication abilities over the past week and b) identify at least one strategy to improve performance on similar tasks in the future   Baseline: Goal status: INITIAL  2.   To improve communication confidence, pt will (a) create and (b) demonstrate use of a verbal communication disclosure statement given guided questioning   Baseline:  Goal status: INITIAL  LONG TERM GOALS: Target date: 03/16/2023  To improve use of circumlocution strategy, pt will provide >4 pertinent details for 5/5 target words during Semantic Feature Analysis activity, given direct questioning by SLP or family.   Baseline:  Goal status: INITIAL  2.  Patient and family will be able to state 3 services that are available locally to offer support for dementia services.  Baseline:  Goal status: INITIAL  3.  With Min A, patient/family will demonstrate understanding of the following concepts: dementia, primary progressive aphasia,  communication vs conversation, strengths/strategies to promote success,  local resources by answering multiple choice questions with 80% accuracy when provided supported conversation in order to increase patient's participation in medical care.  Baseline:  Goal status: INITIAL   ASSESSMENT:  CLINICAL IMPRESSION: Patient is a 67 y.o. female who was seen today for a cognitive communication treatment. 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 Her communication is c/b fluent hyperverbal speech that is often vague containing "this, that, you know," frequently fluctuating to topics that she is familiar with which in turn results in off-topic communication. 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 Her deficits are with word finding (naming), verbal fluency, attention, memory and executive functioning as well as pragmatics with deficits observed in interpreting nonverbal communication, observing personal space and an overall euphoric response to therapy out of proportion to current deficits as well as moderate disinhibition.   Pt's husband attended today's session. See the above skilled treatment note for details on education.   OBJECTIVE IMPAIRMENTS include attention, memory, awareness, executive functioning, and expressive language. These impairments are limiting patient from managing medications, managing appointments, managing finances, household responsibilities, ADLs/IADLs, and effectively communicating at home and in community. Factors affecting potential to achieve goals and functional outcome are ability to learn/carryover information, co-morbidities, medical prognosis, previous level of function, and severity of impairments. Patient will benefit from skilled SLP services to address above impairments and improve overall function.  REHAB POTENTIAL: Good  PLAN:  SLP FREQUENCY: 1-2x/week  SLP DURATION: 12 weeks  PLANNED INTERVENTIONS:  Language facilitation, Cueing hierachy, Internal/external aids, Functional tasks, Multimodal communication approach, SLP instruction and feedback, Compensatory strategies, and Patient/family education    Darus Hershman B. Dreama Saa, M.S., CCC-SLP, Tree surgeon Certified Brain Injury Specialist [facility]  [facility] Rehabilitation Services Office [phone] Ascom [phone] Fax [phone]

## 2023-01-18 ENCOUNTER — Ambulatory Visit: Payer: Medicare HMO | Admitting: Speech Pathology

## 2023-01-18 DIAGNOSIS — R4701 Aphasia: Secondary | ICD-10-CM

## 2023-01-18 DIAGNOSIS — F0280 Dementia in other diseases classified elsewhere without behavioral disturbance: Secondary | ICD-10-CM

## 2023-01-18 DIAGNOSIS — R41841 Cognitive communication deficit: Secondary | ICD-10-CM

## 2023-01-18 NOTE — Therapy (Unsigned)
OUTPATIENT SPEECH LANGUAGE PATHOLOGY TREATMENT   Patient Name: Susan Patterson MRN: [id number] DOB:30-May-1955, 67 y.o., female Today's Date: 01/18/2023  PCP: Maurine Minister, MD REFERRING PROVIDER: Janice Coffin, PA   End of Session - 01/18/23 1611     Visit Number 8    Number of Visits 25    Date for SLP Re-Evaluation 03/16/23    Authorization Type [facility] PPO/[employer] Medicare    Authorization Time Period 12/22/2022 thru 03/16/2023    Authorization - Visit Number 8    Authorization - Number of Visits 25    Progress Note Due on Visit 10    SLP Start Time 1100    SLP Stop Time  1145    SLP Time Calculation (min) 45 min    Activity Tolerance Patient tolerated treatment well             Past Medical History:  Diagnosis Date   Heart murmur    Hyperlipidemia    Mitral valve prolapse    Past Surgical History:  Procedure Laterality Date   BIOPSY BREAST     BREAST CYST ASPIRATION Left 1990'S   BREAST EXCISIONAL BIOPSY Left 1990's   benign   CHOLECYSTECTOMY     Patient Active Problem List   Diagnosis Date Noted   Aphasia 08/05/2020   Leg pain 08/05/2020   Hyperlipidemia 08/05/2020    ONSET DATE: 05/07/2020; date of referral  12/21/2022  REFERRING DIAG: G 31.84 Mild Cognitive Impairment  THERAPY DIAG:  Aphasia  Primary progressive aphasia (HCC)  Cognitive communication deficit  Rationale for Evaluation and Treatment Rehabilitation  SUBJECTIVE:   PERTINENT HISTORY: Pt is a right handed 67 year old female who initially exhibited concerns for semantic variant primary progressive aphasia during neuropsych testing (05/07/2020) for which she completed a brief course of Outpatient ST (6 sessions). Per chart " Mild Cognitive Impairment negative CSF for alzheimer's pathology (? 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 Affective and behavioral components are present in the form of mild disinhibition. Dementia Severity Scale Score: 11"   DIAGNOSTIC FINDINGS:   August 11/03/2022 Western Aphasia Battery-Revised (WAB-R): Part 1  Spontaneous Speech:  Informational Content: 9/10 Fluency, Grammatical Content, and Paraphasias: 9/10 (Some hesitations and word finding difficulty)  Auditory Verbal Comprehension:  Yes/No Questions: 60/60 Auditory Word Recognition: 53/60 Sequential Commands: 76/80  Repetition:  Single Words, Phrases, Sentences: 90/100  Naming and Word Finding: Object Naming: 27/60 Word Fluency: 4/20 Sentence Completion: 10/10 Responsive Speech: 9/10  Error types: anomia and semantic paraphasias Cues given: tactile, semantic, and phonemic  Aphasia Quotient: 82.9 Aphasia Severity: Mild (76 and above) Aphasia Type: Anomic    PAIN:  Are you having pain? No  FALLS: Has patient fallen in last 6 months?  No  LIVING ENVIRONMENT: Lives with: lives with their spouse Lives in: House/apartment  PLOF:  Level of assistance: Needed assistance with ADLs, Needed assistance with IADLS Employment: Retired   PATIENT GOALS    "to figure out what is going on"  SUBJECTIVE STATEMENT: Pt had rescheduled today's appt and then rescheduled it for original time as her husband was able to attend session Pt accompanied by: self and her husband  OBJECTIVE:   TODAY'S TREATMENT:  Skilled treatment session focused on pt's cognitive communication goals. SLP facilitated session by providing the following interventions:  Pt's husband was able to attends today's session, therefore skilled verbal information was provided  on dx of Primary Progressive Aphasia, dementia, word finding and cognitive communication impairments in memory, attention and social abilities.   He provides that pt has decreased social awareness and they have developed cues to help pt cease  inappropriate conversational topics (pt is not able to recall this information), pt is more "obsessive about time, anxiety over being late," they have advanced directives in place d/t falling victim to scam, and he provides that pt is now more passive in conversation d/t inability to follow topic.   Education provided on difficulty pt might have advocating for herself in an emergency situation and continue to recommend pt attend driving class per neurology recommendation.    PATIENT EDUCATION: Education details: see above Person educated: Patient Education method: Explanation Education comprehension: needs further education  HOME EXERCISE PROGRAM:   Practice writing names of common objects  Reading out loud    GOALS:  Goals reviewed with patient? Yes  SHORT TERM GOALS: Target date: 10 sessions   To improve self-awareness, pt/family will a) identify at least one success and one challenge related to cognitive-communication abilities over the past week and b) identify at least one strategy to improve performance on similar tasks in the future   Baseline: Goal status: INITIAL  2.   To improve communication confidence, pt will (a) create and (b) demonstrate use of a verbal communication disclosure statement given guided questioning   Baseline:  Goal status: INITIAL  LONG TERM GOALS: Target date: 03/16/2023  To improve use of circumlocution strategy, pt will provide >4 pertinent details for 5/5 target words during Semantic Feature Analysis activity, given direct questioning by SLP or family.   Baseline:  Goal status: INITIAL  2.  Patient and family will be able to state 3 services that are available locally to offer support for dementia services.  Baseline:  Goal status: INITIAL  3.  With Min A, patient/family will demonstrate understanding of the following concepts: dementia, primary progressive aphasia,  communication vs conversation, strengths/strategies to promote success,  local resources by answering multiple choice questions with 80% accuracy when provided supported conversation in order to increase patient's participation in medical care.  Baseline:  Goal status: INITIAL   ASSESSMENT:  CLINICAL IMPRESSION: Patient is a 67 y.o. female who was seen today for a cognitive communication treatment. 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 Her communication is c/b fluent hyperverbal speech that is often vague containing "this, that, you know," frequently fluctuating to topics that she is familiar with which in turn results in off-topic communication. 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 Her deficits are with word finding (naming), verbal fluency, attention, memory and executive functioning as well as pragmatics with deficits observed in interpreting nonverbal communication, observing personal space and an overall euphoric response to therapy out of proportion to current deficits as well as moderate disinhibition.   Pt's husband attended today's session. See the above skilled treatment note for details on education.   OBJECTIVE IMPAIRMENTS include attention, memory, awareness, executive functioning, and expressive language. These impairments are limiting patient from managing medications, managing appointments, managing finances, household responsibilities, ADLs/IADLs, and effectively communicating at home and in community. Factors affecting potential to achieve goals and functional outcome are ability to learn/carryover information, co-morbidities, medical prognosis, previous level of function, and severity of impairments. Patient will benefit from skilled SLP services to address above impairments and improve overall function.  REHAB POTENTIAL: Good  PLAN:  SLP FREQUENCY: 1-2x/week  SLP DURATION: 12 weeks  PLANNED INTERVENTIONS:  Language facilitation, Cueing hierachy, Internal/external aids, Functional tasks, Multimodal communication approach, SLP instruction and feedback, Compensatory strategies, and Patient/family education    Cadey Bazile B. Dreama Saa, M.S., CCC-SLP, Tree surgeon Certified Brain Injury Specialist [facility]  [facility] Rehabilitation Services Office [phone] Ascom [phone] Fax [phone]

## 2023-01-20 ENCOUNTER — Ambulatory Visit: Payer: Medicare HMO | Admitting: Speech Pathology

## 2023-01-20 DIAGNOSIS — R4701 Aphasia: Secondary | ICD-10-CM

## 2023-01-20 DIAGNOSIS — R41841 Cognitive communication deficit: Secondary | ICD-10-CM

## 2023-01-20 DIAGNOSIS — F0280 Dementia in other diseases classified elsewhere without behavioral disturbance: Secondary | ICD-10-CM

## 2023-01-20 NOTE — Therapy (Signed)
OUTPATIENT SPEECH LANGUAGE PATHOLOGY TREATMENT   Patient Name: Susan Patterson MRN: [id number] DOB:07-31-1955, 67 y.o., female Today's Date: 01/20/2023  PCP: Maurine Minister, MD REFERRING PROVIDER: Janice Coffin, PA   End of Session - 01/20/23 0851     Visit Number 9    Number of Visits 25    Date for SLP Re-Evaluation 03/16/23    Authorization Type [facility] PPO/[employer] Medicare    Authorization Time Period 12/22/2022 thru 03/16/2023    Authorization - Visit Number 9    Authorization - Number of Visits 25    Progress Note Due on Visit 10    SLP Start Time 0847    SLP Stop Time  0930    SLP Time Calculation (min) 43 min    Activity Tolerance Patient tolerated treatment well             Past Medical History:  Diagnosis Date   Heart murmur    Hyperlipidemia    Mitral valve prolapse    Past Surgical History:  Procedure Laterality Date   BIOPSY BREAST     BREAST CYST ASPIRATION Left 1990'S   BREAST EXCISIONAL BIOPSY Left 1990's   benign   CHOLECYSTECTOMY     Patient Active Problem List   Diagnosis Date Noted   Aphasia 08/05/2020   Leg pain 08/05/2020   Hyperlipidemia 08/05/2020    ONSET DATE: 05/07/2020; date of referral  12/21/2022  REFERRING DIAG: G 31.84 Mild Cognitive Impairment  THERAPY DIAG:  Aphasia  Cognitive communication deficit  Primary progressive aphasia (HCC)  Rationale for Evaluation and Treatment Rehabilitation  SUBJECTIVE:   PERTINENT HISTORY: Pt is a right handed 67 year old female who initially exhibited concerns for semantic variant primary progressive aphasia during neuropsych testing (05/07/2020) for which she completed a brief course of Outpatient ST (6 sessions). Per chart " Mild Cognitive Impairment negative CSF for alzheimer's pathology (? 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 Affective and behavioral components are present in the form of mild disinhibition. Dementia Severity Scale Score: 11"   DIAGNOSTIC FINDINGS:   August 11/03/2022 Western Aphasia Battery-Revised (WAB-R): Part 1  Spontaneous Speech:  Informational Content: 9/10 Fluency, Grammatical Content, and Paraphasias: 9/10 (Some hesitations and word finding difficulty)  Auditory Verbal Comprehension:  Yes/No Questions: 60/60 Auditory Word Recognition: 53/60 Sequential Commands: 76/80  Repetition:  Single Words, Phrases, Sentences: 90/100  Naming and Word Finding: Object Naming: 27/60 Word Fluency: 4/20 Sentence Completion: 10/10 Responsive Speech: 9/10  Error types: anomia and semantic paraphasias Cues given: tactile, semantic, and phonemic  Aphasia Quotient: 82.9 Aphasia Severity: Mild (76 and above) Aphasia Type: Anomic    PAIN:  Are you having pain? No  FALLS: Has patient fallen in last 6 months?  No  LIVING ENVIRONMENT: Lives with: lives with their spouse Lives in: House/apartment  PLOF:  Level of assistance: Needed assistance with ADLs, Needed assistance with IADLS Employment: Retired   PATIENT GOALS    "to figure out what is going on"  SUBJECTIVE STATEMENT: Pt eager, pleasant, intermittent awareness of deficits during today's session Pt accompanied by: self and her husband  OBJECTIVE:   TODAY'S TREATMENT:  Skilled treatment session focused on pt's cognitive communication goals. SLP facilitated session by providing the following interventions:  Lexical retrieval method used with basic common objects: pt able to name 12 out of 20 independently with improvement to 15 out of 20  using lexical retrieval.   Pt with intermittent semantic paraphasias throughout session and ~ 75% of awareness and self-correction.   PATIENT EDUCATION: Education details: see above Person educated: Patient Education method: Explanation Education  comprehension: needs further education  HOME EXERCISE PROGRAM:   Practice writing names of common objects  Reading out loud    GOALS:  Goals reviewed with patient? Yes  SHORT TERM GOALS: Target date: 10 sessions   To improve self-awareness, pt/family will a) identify at least one success and one challenge related to cognitive-communication abilities over the past week and b) identify at least one strategy to improve performance on similar tasks in the future   Baseline: Goal status: INITIAL  2.   To improve communication confidence, pt will (a) create and (b) demonstrate use of a verbal communication disclosure statement given guided questioning   Baseline:  Goal status: INITIAL  LONG TERM GOALS: Target date: 03/16/2023  To improve use of circumlocution strategy, pt will provide >4 pertinent details for 5/5 target words during Semantic Feature Analysis activity, given direct questioning by SLP or family.   Baseline:  Goal status: INITIAL  2.  Patient and family will be able to state 3 services that are available locally to offer support for dementia services.  Baseline:  Goal status: INITIAL  3.  With Min A, patient/family will demonstrate understanding of the following concepts: dementia, primary progressive aphasia,  communication vs conversation, strengths/strategies to promote success, local resources by answering multiple choice questions with 80% accuracy when provided supported conversation in order to increase patient's participation in medical care.  Baseline:  Goal status: INITIAL   ASSESSMENT:  CLINICAL IMPRESSION: Patient is a 67 y.o. female who was seen today for a cognitive communication treatment. 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 Her communication is c/b fluent hyperverbal speech that is often vague containing "this, that, you know," frequently fluctuating to topics that she is familiar with which in  turn results in off-topic communication. 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 Her deficits are with word finding (naming), verbal fluency, attention, memory and executive functioning as well as pragmatics with deficits observed in interpreting nonverbal communication, observing personal space and an overall euphoric response to therapy out of proportion to current deficits as well as moderate disinhibition.   Lexical retrieval appears helpful with facilitating word finding.  See the above skilled treatment note for details on education.   OBJECTIVE IMPAIRMENTS include attention, memory, awareness, executive functioning, and expressive language. These impairments are limiting patient from managing medications, managing appointments, managing finances, household responsibilities, ADLs/IADLs, and effectively communicating at home and in community. Factors affecting potential to achieve goals and functional outcome are ability to learn/carryover information, co-morbidities, medical prognosis, previous level of function, and severity of impairments. Patient will benefit from skilled SLP services to address above impairments and improve overall function.  REHAB POTENTIAL: Good  PLAN: SLP FREQUENCY: 1-2x/week  SLP DURATION: 12 weeks  PLANNED INTERVENTIONS: Language facilitation, Cueing hierachy, Internal/external aids, Functional tasks, Multimodal communication approach, SLP instruction and feedback, Compensatory strategies, and Patient/family education    Susan Patterson B. Dreama Saa, M.S., CCC-SLP, Tree surgeon Certified Brain Injury Specialist [facility]  [facility] Rehabilitation Services Office [phone] Ascom [phone] Fax [phone]

## 2023-01-26 ENCOUNTER — Ambulatory Visit: Payer: Medicare HMO | Admitting: Speech Pathology

## 2023-01-26 DIAGNOSIS — R4701 Aphasia: Secondary | ICD-10-CM

## 2023-01-26 DIAGNOSIS — R41841 Cognitive communication deficit: Secondary | ICD-10-CM

## 2023-01-26 DIAGNOSIS — F0280 Dementia in other diseases classified elsewhere without behavioral disturbance: Secondary | ICD-10-CM

## 2023-01-26 NOTE — Therapy (Signed)
OUTPATIENT SPEECH LANGUAGE PATHOLOGY TREATMENT 10th VISIT PROGRESS NOTE   Patient Name: Susan Patterson MRN: [id number] DOB:03-24-1955, 67 y.o., female Today's Date: 01/26/2023  PCP: Maurine Minister, MD REFERRING PROVIDER: Janice Coffin, PA   Speech Therapy Progress Note  Dates of Reporting Period: 12/22/2022 to 01/26/2023  Objective: Patient has been seen for 10 speech therapy sessions this reporting period targeting Semantic Variant Primary Progressive Aphasia. Patient is making progress toward LTGs and has made great progress towards her STGs (especially awareness of deficits) this reporting period. See skilled intervention, clinical impressions, and goals below for details.   End of Session - 01/26/23 0847     Visit Number 10    Number of Visits 25    Date for SLP Re-Evaluation 03/16/23    Authorization Type [facility] Health PPO/[employer] Medicare    Authorization Time Period 12/22/2022 thru 03/16/2023    Authorization - Visit Number 10    Authorization - Number of Visits 25    Progress Note Due on Visit 10    SLP Start Time 0845    SLP Stop Time  0930    SLP Time Calculation (min) 45 min    Activity Tolerance Patient tolerated treatment well             Past Medical History:  Diagnosis Date   Heart murmur    Hyperlipidemia    Mitral valve prolapse    Past Surgical History:  Procedure Laterality Date   BIOPSY BREAST     BREAST CYST ASPIRATION Left 1990'S   BREAST EXCISIONAL BIOPSY Left 1990's   benign   CHOLECYSTECTOMY     Patient Active Problem List   Diagnosis Date Noted   Aphasia 08/05/2020   Leg pain 08/05/2020   Hyperlipidemia 08/05/2020    ONSET DATE: 05/07/2020; date of referral  12/21/2022  REFERRING DIAG: G 31.84 Mild Cognitive Impairment  THERAPY DIAG:  Aphasia  Cognitive communication deficit  Primary progressive aphasia (HCC)  Rationale for Evaluation and Treatment Rehabilitation  SUBJECTIVE:   PERTINENT HISTORY: Pt is  a right handed 67 year old female who initially exhibited concerns for semantic variant primary progressive aphasia during neuropsych testing (05/07/2020) for which she completed a brief course of Outpatient ST (6 sessions). Per chart " Mild Cognitive Impairment negative CSF for alzheimer's pathology (? Frontotemporal Dementia - primary language impairment and OCD behaviors), mildly elevated neurofilament light chain, neuropsych testing showed Cognitive decreases are in executive function, language, memory. Affective and behavioral components are present in the form of mild disinhibition. Dementia Severity Scale Score: 11"   DIAGNOSTIC FINDINGS:   August 11/03/2022 Western Aphasia Battery-Revised (WAB-R): Part 1  Spontaneous Speech:  Informational Content: 9/10 Fluency, Grammatical Content, and Paraphasias: 9/10 (Some hesitations and word finding difficulty)  Auditory Verbal Comprehension:  Yes/No Questions: 60/60 Auditory Word Recognition: 53/60 Sequential Commands: 76/80  Repetition:  Single Words, Phrases, Sentences: 90/100  Naming and Word Finding: Object Naming: 27/60 Word Fluency: 4/20 Sentence Completion: 10/10 Responsive Speech: 9/10  Error types: anomia and semantic paraphasias Cues given: tactile, semantic, and phonemic  Aphasia Quotient: 82.9 Aphasia Severity: Mild (76 and above) Aphasia Type: Anomic    PAIN:  Are you having pain? No  FALLS: Has patient fallen in last 6 months?  No  LIVING ENVIRONMENT: Lives with: lives with their spouse Lives in: House/apartment  PLOF:  Level of assistance: Needed assistance with ADLs, Needed assistance with IADLS Employment: Retired   PATIENT GOALS    "to figure out what is going on"  SUBJECTIVE STATEMENT: Pt eager, pleasant, increased awareness of word finding deficits during today's session Pt accompanied by: self   OBJECTIVE:   TODAY'S TREATMENT:  Skilled treatment session focused on pt's cognitive  communication goals. SLP facilitated session by providing the following interventions:  "I have been noticing that I know what it is, but I can't say it, I recognize it but I can't say the name"  Lexical retrieval method used with basic common objects: pt able to name 18 out of 20 independently much improved and she was able to correctly spell each word in 17 out of 20 opportunities    PATIENT EDUCATION: Education details: see above Person educated: Patient Education method: Explanation Education comprehension: needs further education  HOME EXERCISE PROGRAM:   Practice writing names of common objects  Reading out loud Writing down activities into book    GOALS:  Goals reviewed with patient? Yes  SHORT TERM GOALS: Target date: 10 sessions   To improve self-awareness, pt/family will a) identify at least one success and one challenge related to cognitive-communication abilities over the past week and b) identify at least one strategy to improve performance on similar tasks in the future   Baseline: Goal status: INITIAL: ongoing progress made  2.   To improve communication confidence, pt will (a) create and (b) demonstrate use of a verbal communication disclosure statement given guided questioning   Baseline:  Goal status: INITIAL: progress made, ongoing  LONG TERM GOALS: Target date: 03/16/2023  To improve use of circumlocution strategy, pt will provide >4 pertinent details for 5/5 target words during Semantic Feature Analysis activity, given direct questioning by SLP or family.   Baseline:  Goal status: INITIAL: progress made; ongoing  2.  Patient and family will be able to state 3 services that are available locally to offer support for dementia services.  Baseline:  Goal status: INITIAL: progress made, ongoing  3.  With Min A, patient/family will demonstrate understanding of the following concepts: dementia, primary progressive aphasia,  communication vs conversation,  strengths/strategies to promote success, local resources by answering multiple choice questions with 80% accuracy when provided supported conversation in order to increase patient's participation in medical care.  Baseline:  Goal status: INITIAL: progress made, ongoing   ASSESSMENT:  CLINICAL IMPRESSION: Patient is a 67 y.o. female who was seen today for a cognitive communication treatment. 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 Her communication is c/b fluent hyperverbal speech that is often vague containing "this, that, you know," frequently fluctuating to topics that she is familiar with which in turn results in off-topic communication. 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 Her deficits are with word finding (naming), verbal fluency, attention, memory and executive functioning as well as pragmatics with deficits observed in interpreting nonverbal communication, observing personal space and an overall euphoric response to therapy out of proportion to current deficits as well as mild disinhibition.   Pt has made good progress d/t her eagerness especially with overall awareness of deficits. As a result, of increased awareness, she has been eager to practice and perform language/cognition activities outside of ST sessions.  See the above skilled treatment note for details on education.   OBJECTIVE IMPAIRMENTS include attention, memory, awareness, executive functioning, and expressive language. These impairments are limiting patient from managing medications, managing appointments, managing finances, household responsibilities, ADLs/IADLs, and effectively communicating at home and in community. Factors affecting potential to achieve goals and functional outcome are  ability to learn/carryover information, co-morbidities, medical prognosis, previous level of function, and severity of  impairments. Patient will benefit from skilled SLP services to address above impairments and improve overall function.  REHAB POTENTIAL: Good  PLAN: SLP FREQUENCY: 1-2x/week  SLP DURATION: 12 weeks  PLANNED INTERVENTIONS: Language facilitation, Cueing hierachy, Internal/external aids, Functional tasks, Multimodal communication approach, SLP instruction and feedback, Compensatory strategies, and Patient/family education    Zannah Melucci B. Dreama Saa, M.S., CCC-SLP, Tree surgeon Certified Brain Injury Specialist [facility]  [facility] Rehabilitation Services Office [phone] Ascom [phone] Fax [phone]

## 2023-01-28 ENCOUNTER — Ambulatory Visit: Payer: Medicare HMO | Admitting: Speech Pathology

## 2023-01-31 ENCOUNTER — Ambulatory Visit: Payer: Medicare HMO | Admitting: Speech Pathology

## 2023-02-01 ENCOUNTER — Ambulatory Visit: Payer: Medicare HMO | Admitting: Speech Pathology

## 2023-02-01 DIAGNOSIS — R41841 Cognitive communication deficit: Secondary | ICD-10-CM

## 2023-02-01 DIAGNOSIS — F0280 Dementia in other diseases classified elsewhere without behavioral disturbance: Secondary | ICD-10-CM

## 2023-02-01 DIAGNOSIS — R4701 Aphasia: Secondary | ICD-10-CM | POA: Diagnosis not present

## 2023-02-01 NOTE — Therapy (Signed)
OUTPATIENT SPEECH LANGUAGE PATHOLOGY TREATMENT    Patient Name: Susan Patterson MRN: [id number] DOB:June 07, 1955, 67 y.o., female Today's Date: 02/01/2023  PCP: Maurine Minister, MD REFERRING PROVIDER: Janice Coffin, PA     End of Session - 02/01/23 0843     Visit Number 11    Number of Visits 25    Date for SLP Re-Evaluation 03/16/23    Authorization Type [facility] Health PPO/[employer] Medicare    Authorization Time Period 12/22/2022 thru 03/16/2023    Authorization - Visit Number 11    Authorization - Number of Visits 25    Progress Note Due on Visit 20    SLP Start Time 0845    SLP Stop Time  0930    SLP Time Calculation (min) 45 min    Activity Tolerance Patient tolerated treatment well             Past Medical History:  Diagnosis Date   Heart murmur    Hyperlipidemia    Mitral valve prolapse    Past Surgical History:  Procedure Laterality Date   BIOPSY BREAST     BREAST CYST ASPIRATION Left 1990'S   BREAST EXCISIONAL BIOPSY Left 1990's   benign   CHOLECYSTECTOMY     Patient Active Problem List   Diagnosis Date Noted   Aphasia 08/05/2020   Leg pain 08/05/2020   Hyperlipidemia 08/05/2020    ONSET DATE: 05/07/2020; date of referral  12/21/2022  REFERRING DIAG: G 31.84 Mild Cognitive Impairment  THERAPY DIAG:  Aphasia  Cognitive communication deficit  Primary progressive aphasia (HCC)  Rationale for Evaluation and Treatment Rehabilitation  SUBJECTIVE:   PERTINENT HISTORY: Pt is a right handed 67 year old female who initially exhibited concerns for semantic variant primary progressive aphasia during neuropsych testing (05/07/2020) for which she completed a brief course of Outpatient ST (6 sessions). Per chart " Mild Cognitive Impairment negative CSF for alzheimer's pathology (? Frontotemporal Dementia - primary language impairment and OCD behaviors), mildly elevated neurofilament light chain, neuropsych testing showed Cognitive decreases are  in executive function, language, memory. Affective and behavioral components are present in the form of mild disinhibition. Dementia Severity Scale Score: 11"   DIAGNOSTIC FINDINGS:   August 11/03/2022 Western Aphasia Battery-Revised (WAB-R): Part 1  Spontaneous Speech:  Informational Content: 9/10 Fluency, Grammatical Content, and Paraphasias: 9/10 (Some hesitations and word finding difficulty)  Auditory Verbal Comprehension:  Yes/No Questions: 60/60 Auditory Word Recognition: 53/60 Sequential Commands: 76/80  Repetition:  Single Words, Phrases, Sentences: 90/100  Naming and Word Finding: Object Naming: 27/60 Word Fluency: 4/20 Sentence Completion: 10/10 Responsive Speech: 9/10  Error types: anomia and semantic paraphasias Cues given: tactile, semantic, and phonemic  Aphasia Quotient: 82.9 Aphasia Severity: Mild (76 and above) Aphasia Type: Anomic    PAIN:  Are you having pain? No  FALLS: Has patient fallen in last 6 months?  No  LIVING ENVIRONMENT: Lives with: lives with their spouse Lives in: House/apartment  PLOF:  Level of assistance: Needed assistance with ADLs, Needed assistance with IADLS Employment: Retired   PATIENT GOALS    "to figure out what is going on"  SUBJECTIVE STATEMENT: Pt eager, pleasant, increased awareness of word finding deficits during today's session Pt accompanied by: self   OBJECTIVE:   TODAY'S TREATMENT:  Skilled treatment session focused on pt's cognitive communication goals. SLP facilitated session by providing the following interventions:  Pt engaged in conversation with Mod I use of compensatory strategies and 1 instances of word finding semantic paraphasia which she  was aware and was able to self-correct - much improvement   PATIENT EDUCATION: Education details: see above Person educated: Patient Education method: Explanation Education comprehension: needs further education  HOME EXERCISE PROGRAM:   Practice  writing names of common objects  Reading out loud Writing down activities into book    GOALS:  Goals reviewed with patient? Yes  SHORT TERM GOALS: Target date: 10 sessions   To improve self-awareness, pt/family will a) identify at least one success and one challenge related to cognitive-communication abilities over the past week and b) identify at least one strategy to improve performance on similar tasks in the future   Baseline: Goal status: INITIAL: ongoing progress made  2.   To improve communication confidence, pt will (a) create and (b) demonstrate use of a verbal communication disclosure statement given guided questioning   Baseline:  Goal status: INITIAL: progress made, ongoing  LONG TERM GOALS: Target date: 03/16/2023  To improve use of circumlocution strategy, pt will provide >4 pertinent details for 5/5 target words during Semantic Feature Analysis activity, given direct questioning by SLP or family.   Baseline:  Goal status: INITIAL: progress made; ongoing  2.  Patient and family will be able to state 3 services that are available locally to offer support for dementia services.  Baseline:  Goal status: INITIAL: progress made, ongoing  3.  With Min A, patient/family will demonstrate understanding of the following concepts: dementia, primary progressive aphasia,  communication vs conversation, strengths/strategies to promote success, local resources by answering multiple choice questions with 80% accuracy when provided supported conversation in order to increase patient's participation in medical care.  Baseline:  Goal status: INITIAL: progress made, ongoing   ASSESSMENT:  CLINICAL IMPRESSION: Patient is a 67 y.o. female who was seen today for a cognitive communication treatment. 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 Her communication is c/b fluent hyperverbal speech that is often vague containing "this, that,  you know," frequently fluctuating to topics that she is familiar with which in turn results in off-topic communication. 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 Her deficits are with word finding (naming), verbal fluency, attention, memory and executive functioning as well as pragmatics with deficits observed in interpreting nonverbal communication, observing personal space and an overall euphoric response to therapy out of proportion to current deficits as well as mild disinhibition.   Pt continues to demonstrate some stability and progress.  See the above skilled treatment note for details on education.   OBJECTIVE IMPAIRMENTS include attention, memory, awareness, executive functioning, and expressive language. These impairments are limiting patient from managing medications, managing appointments, managing finances, household responsibilities, ADLs/IADLs, and effectively communicating at home and in community. Factors affecting potential to achieve goals and functional outcome are ability to learn/carryover information, co-morbidities, medical prognosis, previous level of function, and severity of impairments. Patient will benefit from skilled SLP services to address above impairments and improve overall function.  REHAB POTENTIAL: Good  PLAN: SLP FREQUENCY: 1-2x/week  SLP DURATION: 12 weeks  PLANNED INTERVENTIONS: Language facilitation, Cueing hierachy, Internal/external aids, Functional tasks, Multimodal communication approach, SLP instruction and feedback, Compensatory strategies, and Patient/family education    Susan Patterson B. Dreama Saa, M.S., CCC-SLP, Tree surgeon Certified Brain Injury Specialist [facility]  [facility] Rehabilitation Services Office [phone] Ascom [phone] Fax [phone]

## 2023-02-02 ENCOUNTER — Encounter: Payer: BC Managed Care – PPO | Admitting: Speech Pathology

## 2023-02-07 ENCOUNTER — Ambulatory Visit: Payer: Medicare HMO | Attending: Student | Admitting: Speech Pathology

## 2023-02-07 DIAGNOSIS — F0280 Dementia in other diseases classified elsewhere without behavioral disturbance: Secondary | ICD-10-CM | POA: Diagnosis present

## 2023-02-07 DIAGNOSIS — R41841 Cognitive communication deficit: Secondary | ICD-10-CM | POA: Insufficient documentation

## 2023-02-07 DIAGNOSIS — G3101 Pick's disease: Secondary | ICD-10-CM | POA: Insufficient documentation

## 2023-02-07 DIAGNOSIS — R4701 Aphasia: Secondary | ICD-10-CM | POA: Insufficient documentation

## 2023-02-07 NOTE — Therapy (Signed)
OUTPATIENT SPEECH LANGUAGE PATHOLOGY TREATMENT    Patient Name: Susan Patterson MRN: [id number] DOB:Apr 12, 1955, 67 y.o., female Today's Date: 02/07/2023  PCP: Maurine Minister, MD REFERRING PROVIDER: Janice Coffin, PA     End of Session - 02/07/23 0853     Visit Number 12    Number of Visits 25    Date for SLP Re-Evaluation 03/16/23    Authorization Type [facility] Health PPO/[employer] Medicare    Authorization Time Period 12/22/2022 thru 03/16/2023    Authorization - Visit Number 12    Authorization - Number of Visits 25    Progress Note Due on Visit 20    SLP Start Time 0845    SLP Stop Time  0930    SLP Time Calculation (min) 45 min    Activity Tolerance Patient tolerated treatment well             Past Medical History:  Diagnosis Date   Heart murmur    Hyperlipidemia    Mitral valve prolapse    Past Surgical History:  Procedure Laterality Date   BIOPSY BREAST     BREAST CYST ASPIRATION Left 1990'S   BREAST EXCISIONAL BIOPSY Left 1990's   benign   CHOLECYSTECTOMY     Patient Active Problem List   Diagnosis Date Noted   Aphasia 08/05/2020   Leg pain 08/05/2020   Hyperlipidemia 08/05/2020    ONSET DATE: 05/07/2020; date of referral  12/21/2022  REFERRING DIAG: G 31.84 Mild Cognitive Impairment  THERAPY DIAG:  Aphasia  Cognitive communication deficit  Primary progressive aphasia (HCC)  Rationale for Evaluation and Treatment Rehabilitation  SUBJECTIVE:   PERTINENT HISTORY: Pt is a right handed 67 year old female who initially exhibited concerns for semantic variant primary progressive aphasia during neuropsych testing (05/07/2020) for which she completed a brief course of Outpatient ST (6 sessions). Per chart " Mild Cognitive Impairment negative CSF for alzheimer's pathology (? 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 Affective and behavioral components are present in the form of mild disinhibition. Dementia Severity Scale Score: 11"   DIAGNOSTIC FINDINGS:   August 11/03/2022 Western Aphasia Battery-Revised (WAB-R): Part 1  Spontaneous Speech:  Informational Content: 9/10 Fluency, Grammatical Content, and Paraphasias: 9/10 (Some hesitations and word finding difficulty)  Auditory Verbal Comprehension:  Yes/No Questions: 60/60 Auditory Word Recognition: 53/60 Sequential Commands: 76/80  Repetition:  Single Words, Phrases, Sentences: 90/100  Naming and Word Finding: Object Naming: 27/60 Word Fluency: 4/20 Sentence Completion: 10/10 Responsive Speech: 9/10  Error types: anomia and semantic paraphasias Cues given: tactile, semantic, and phonemic  Aphasia Quotient: 82.9 Aphasia Severity: Mild (76 and above) Aphasia Type: Anomic    PAIN:  Are you having pain? No  FALLS: Has patient fallen in last 6 months?  No  LIVING ENVIRONMENT: Lives with: lives with their spouse Lives in: House/apartment  PLOF:  Level of assistance: Needed assistance with ADLs, Needed assistance with IADLS Employment: Retired   PATIENT GOALS    "to figure out what is going on"  SUBJECTIVE STATEMENT: "My Susan Patterson said that if I come one more time this week, that should be enough." Pt accompanied by: self   OBJECTIVE:   TODAY'S TREATMENT:  Skilled treatment session focused on pt's cognitive communication goals. SLP facilitated session by providing the following interventions:  This Clinical research associate along with pt, spoke with her husband over the phone. He reports that pt is  currently maintaining and completing homework to promote language use - POC reduced to 1xmonth for continued strategy introduction   Pt engaged in conversation with independent use of compensatory strategies and no instances of semantic paraphasias  PATIENT EDUCATION: Education details: see above Person  educated: Patient her husband via phone Education method: Explanation Education comprehension: needs further education  HOME EXERCISE PROGRAM:   Practice writing names of common objects  Reading out loud Writing down activities into book    GOALS:  Goals reviewed with patient? Yes  SHORT TERM GOALS: Target date: 10 sessions   To improve self-awareness, pt/family will a) identify at least one success and one challenge related to cognitive-communication abilities over the past week and b) identify at least one strategy to improve performance on similar tasks in the future   Baseline: Goal status: INITIAL: ongoing progress made  2.   To improve communication confidence, pt will (a) create and (b) demonstrate use of a verbal communication disclosure statement given guided questioning   Baseline:  Goal status: INITIAL: progress made, ongoing  LONG TERM GOALS: Target date: 03/16/2023  To improve use of circumlocution strategy, pt will provide >4 pertinent details for 5/5 target words during Semantic Feature Analysis activity, given direct questioning by SLP or family.   Baseline:  Goal status: INITIAL: progress made; ongoing  2.  Patient and family will be able to state 3 services that are available locally to offer support for dementia services.  Baseline:  Goal status: INITIAL: progress made, ongoing  3.  With Min A, patient/family will demonstrate understanding of the following concepts: dementia, primary progressive aphasia,  communication vs conversation, strengths/strategies to promote success, local resources by answering multiple choice questions with 80% accuracy when provided supported conversation in order to increase patient's participation in medical care.  Baseline:  Goal status: INITIAL: progress made, ongoing   ASSESSMENT:  CLINICAL IMPRESSION: Patient is a 67 y.o. female who was seen today for a cognitive communication treatment. Pt presents with moderate  cognitive communication disorder d/t primary progressive aphasia. Her communication is c/b fluent hyperverbal speech that is often vague containing "this, that, you know," frequently fluctuating to topics that she is familiar with which in turn results in off-topic communication. 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 Her deficits are with word finding (naming), verbal fluency, attention, memory and executive functioning as well as pragmatics with deficits observed in interpreting nonverbal communication, observing personal space and an overall euphoric response to therapy out of proportion to current deficits as well as mild disinhibition.   Pt continues to demonstrate some stability and progress.  See the above skilled treatment note for details on education.   OBJECTIVE IMPAIRMENTS include attention, memory, awareness, executive functioning, and expressive language. These impairments are limiting patient from managing medications, managing appointments, managing finances, household responsibilities, ADLs/IADLs, and effectively communicating at home and in community. Factors affecting potential to achieve goals and functional outcome are ability to learn/carryover information, co-morbidities, medical prognosis, previous level of function, and severity of impairments. Patient will benefit from skilled SLP services to address above impairments and improve overall function.  REHAB POTENTIAL: Good  PLAN: SLP FREQUENCY: 1-2x/week  SLP DURATION: 12 weeks  PLANNED INTERVENTIONS: Language facilitation, Cueing hierachy, Internal/external aids, Functional tasks, Multimodal communication approach, SLP instruction and feedback, Compensatory strategies, and Patient/family education    Cleveland Paiz B. Dreama Saa, M.S., CCC-SLP, CBIS Speech-Language Pathologist Certified Brain Injury Specialist [place]  [facility] Rehabilitation  Services Office [phone] Ascom [phone] Fax [phone]

## 2023-02-10 ENCOUNTER — Encounter: Payer: BC Managed Care – PPO | Admitting: Speech Pathology

## 2023-02-15 ENCOUNTER — Ambulatory Visit: Payer: Medicare HMO | Admitting: Speech Pathology

## 2023-02-21 ENCOUNTER — Encounter: Payer: BC Managed Care – PPO | Admitting: Speech Pathology

## 2023-02-24 ENCOUNTER — Encounter: Payer: BC Managed Care – PPO | Admitting: Speech Pathology

## 2023-03-03 ENCOUNTER — Encounter: Payer: BC Managed Care – PPO | Admitting: Speech Pathology

## 2023-03-07 ENCOUNTER — Encounter: Payer: BC Managed Care – PPO | Admitting: Speech Pathology

## 2023-03-10 ENCOUNTER — Encounter: Payer: BC Managed Care – PPO | Admitting: Speech Pathology

## 2023-03-14 ENCOUNTER — Ambulatory Visit: Payer: Medicare PPO | Attending: Student | Admitting: Speech Pathology

## 2023-03-16 ENCOUNTER — Encounter: Payer: BC Managed Care – PPO | Admitting: Speech Pathology

## 2023-03-21 ENCOUNTER — Encounter: Payer: BC Managed Care – PPO | Admitting: Speech Pathology

## 2023-03-23 ENCOUNTER — Encounter: Payer: BC Managed Care – PPO | Admitting: Speech Pathology

## 2023-03-28 ENCOUNTER — Encounter: Payer: BC Managed Care – PPO | Admitting: Speech Pathology

## 2023-03-28 ENCOUNTER — Telehealth: Payer: Self-pay | Admitting: Speech Pathology

## 2023-03-28 NOTE — Telephone Encounter (Signed)
Late entry, followed up with pt regarding missed visit via text - didn't reschedule pt aware of upcoming February appt  Susan Patterson B. Dreama Saa, M.S., CCC-SLP, Tree surgeon Certified Brain Injury Specialist [facility]  [facility] Rehabilitation Services Office [phone] Ascom [phone] Fax [phone]

## 2023-04-04 ENCOUNTER — Encounter: Payer: BC Managed Care – PPO | Admitting: Speech Pathology

## 2023-04-11 ENCOUNTER — Ambulatory Visit: Payer: Medicare Other | Attending: Student | Admitting: Speech Pathology

## 2023-04-11 DIAGNOSIS — F0280 Dementia in other diseases classified elsewhere without behavioral disturbance: Secondary | ICD-10-CM | POA: Diagnosis present

## 2023-04-11 DIAGNOSIS — R41841 Cognitive communication deficit: Secondary | ICD-10-CM | POA: Diagnosis present

## 2023-04-11 DIAGNOSIS — R4701 Aphasia: Secondary | ICD-10-CM | POA: Diagnosis present

## 2023-04-11 DIAGNOSIS — G3101 Pick's disease: Secondary | ICD-10-CM | POA: Diagnosis present

## 2023-04-11 NOTE — Therapy (Signed)
OUTPATIENT SPEECH LANGUAGE PATHOLOGY TREATMENT DISCHARGE SUMMARY    Patient Name: Susan Patterson MRN: [id number] DOB:March 16, 1955, 68 y.o., female Today's Date: 04/11/2023  PCP: Maurine Minister, MD REFERRING PROVIDER: Janice Coffin, PA     End of Session - 04/11/23 0854     Visit Number 13    Number of Visits 15    Date for SLP Re-Evaluation 04/11/23    Authorization Type [employer] PPO/[employer]    Progress Note Due on Visit 20    SLP Start Time 0845    SLP Stop Time  0930    SLP Time Calculation (min) 45 min    Activity Tolerance Patient tolerated treatment well             Past Medical History:  Diagnosis Date   Heart murmur    Hyperlipidemia    Mitral valve prolapse    Past Surgical History:  Procedure Laterality Date   BIOPSY BREAST     BREAST CYST ASPIRATION Left 1990'S   BREAST EXCISIONAL BIOPSY Left 1990's   benign   CHOLECYSTECTOMY     Patient Active Problem List   Diagnosis Date Noted   Aphasia 08/05/2020   Leg pain 08/05/2020   Hyperlipidemia 08/05/2020    ONSET DATE: 05/07/2020; date of referral  12/21/2022  REFERRING DIAG: G 31.84 Mild Cognitive Impairment  THERAPY DIAG:  Cognitive communication deficit  Primary progressive aphasia (HCC)  Aphasia  Rationale for Evaluation and Treatment Rehabilitation  SUBJECTIVE:   PERTINENT HISTORY: Pt is a right handed 68 year old female who initially exhibited concerns for semantic variant primary progressive aphasia during neuropsych testing (05/07/2020) for which she completed a brief course of Outpatient ST (6 sessions). Per chart " Mild Cognitive Impairment negative CSF for alzheimer's pathology (? Frontotemporal Dementia - primary language impairment and OCD behaviors), mildly elevated neurofilament light chain, neuropsych testing showed Cognitive decreases are in executive function, language, memory. Affective and behavioral components are present in the form of mild  disinhibition. Dementia Severity Scale Score: 11"   DIAGNOSTIC FINDINGS:   August 11/03/2022 Western Aphasia Battery-Revised (WAB-R): Part 1  Spontaneous Speech:  Informational Content: 9/10 Fluency, Grammatical Content, and Paraphasias: 9/10 (Some hesitations and word finding difficulty)  Auditory Verbal Comprehension:  Yes/No Questions: 60/60 Auditory Word Recognition: 53/60 Sequential Commands: 76/80  Repetition:  Single Words, Phrases, Sentences: 90/100  Naming and Word Finding: Object Naming: 27/60 Word Fluency: 4/20 Sentence Completion: 10/10 Responsive Speech: 9/10  Error types: anomia and semantic paraphasias Cues given: tactile, semantic, and phonemic  Aphasia Quotient: 82.9 Aphasia Severity: Mild (76 and above) Aphasia Type: Anomic    PAIN:  Are you having pain? No  FALLS: Has patient fallen in last 6 months?  No  LIVING ENVIRONMENT: Lives with: lives with their spouse Lives in: House/apartment  PLOF:  Level of assistance: Needed assistance with ADLs, Needed assistance with IADLS Employment: Retired   PATIENT GOALS    "to figure out what is going on"  SUBJECTIVE STATEMENT: "My [name] said he didn't think that I didn't need to come anymore" Pt accompanied by: self   OBJECTIVE:   TODAY'S TREATMENT:  Skilled treatment session focused on pt's cognitive communication goals. SLP facilitated session by providing the following interventions:  Pt arrives after transitioning to 1 x month and then missing 1 month. During conversational speech  PATIENT EDUCATION: Education details: see above Person educated: Patient her husband via phone Education method: Explanation Education comprehension: needs further education  HOME EXERCISE PROGRAM:  Practice writing names of common objects  Reading out loud Writing down activities into book    GOALS:  Goals reviewed with patient? Yes  SHORT TERM GOALS: Target date: 10 sessions   To improve  self-awareness, pt/family will a) identify at least one success and one challenge related to cognitive-communication abilities over the past week and b) identify at least one strategy to improve performance on similar tasks in the future   Baseline: Goal status: INITIAL: ongoing progress made  2.   To improve communication confidence, pt will (a) create and (b) demonstrate use of a verbal communication disclosure statement given guided questioning   Baseline:  Goal status: INITIAL: progress made, ongoing  LONG TERM GOALS: Target date: 03/16/2023  To improve use of circumlocution strategy, pt will provide >4 pertinent details for 5/5 target words during Semantic Feature Analysis activity, given direct questioning by SLP or family.   Baseline:  Goal status: INITIAL: progress made; ongoing  2.  Patient and family will be able to state 3 services that are available locally to offer support for dementia services.  Baseline:  Goal status: INITIAL: progress made, ongoing  3.  With Min A, patient/family will demonstrate understanding of the following concepts: dementia, primary progressive aphasia,  communication vs conversation, strengths/strategies to promote success, local resources by answering multiple choice questions with 80% accuracy when provided supported conversation in order to increase patient's participation in medical care.  Baseline:  Goal status: INITIAL: progress made, ongoing   ASSESSMENT:  CLINICAL IMPRESSION: Patient is a 68 y.o. female who was seen today for a cognitive communication treatment. 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 Her communication is c/b fluent hyperverbal speech that is often vague containing "this, that, you know," frequently fluctuating to topics that she is familiar with which in turn results in off-topic communication. 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 Her deficits are with word finding (naming), verbal fluency, attention, memory and executive functioning as well as pragmatics with deficits observed in interpreting nonverbal communication, observing personal space and an overall euphoric response to therapy out of proportion to current deficits as well as mild disinhibition.   Pt continues to demonstrate some stability and progress.  See the above skilled treatment note for details on education.   OBJECTIVE IMPAIRMENTS include attention, memory, awareness, executive functioning, and expressive language. These impairments are limiting patient from managing medications, managing appointments, managing finances, household responsibilities, ADLs/IADLs, and effectively communicating at home and in community. Factors affecting potential to achieve goals and functional outcome are ability to learn/carryover information, co-morbidities, medical prognosis, previous level of function, and severity of impairments. Patient will benefit from skilled SLP services to address above impairments and improve overall function.  REHAB POTENTIAL: Good  PLAN: SLP FREQUENCY: 1-2x/week  SLP DURATION: 12 weeks  PLANNED INTERVENTIONS: Language facilitation, Cueing hierachy, Internal/external aids, Functional tasks, Multimodal communication approach, SLP instruction and feedback, Compensatory strategies, and Patient/family education    Ova Meegan B. Dreama Saa, M.S., CCC-SLP, Tree surgeon Certified Brain Injury Specialist [facility]  [facility] Rehabilitation Services Office [phone] Ascom [phone] Fax [phone]

## 2023-05-06 ENCOUNTER — Encounter: Payer: Self-pay | Admitting: Gastroenterology

## 2023-05-09 ENCOUNTER — Encounter: Payer: BC Managed Care – PPO | Admitting: Speech Pathology

## 2023-05-19 ENCOUNTER — Ambulatory Visit: Admitting: Registered Nurse

## 2023-05-19 ENCOUNTER — Encounter: Payer: Self-pay | Admitting: Gastroenterology

## 2023-05-19 ENCOUNTER — Ambulatory Visit
Admission: RE | Admit: 2023-05-19 | Discharge: 2023-05-19 | Disposition: A | Discharge status: Home / Self Care | Payer: Medicare Other | Attending: Gastroenterology | Admitting: Gastroenterology

## 2023-05-19 ENCOUNTER — Encounter
Admission: RE | Disposition: A | Discharge status: Home / Self Care | Payer: Self-pay | Source: Home / Self Care | Attending: Gastroenterology

## 2023-05-19 DIAGNOSIS — Z1211 Encounter for screening for malignant neoplasm of colon: Secondary | ICD-10-CM | POA: Diagnosis present

## 2023-05-19 DIAGNOSIS — Z539 Procedure and treatment not carried out, unspecified reason: Secondary | ICD-10-CM | POA: Insufficient documentation

## 2023-05-19 HISTORY — PX: COLONOSCOPY WITH PROPOFOL: SHX5780

## 2023-05-19 HISTORY — DX: Dementia in other diseases classified elsewhere, unspecified severity, without behavioral disturbance, psychotic disturbance, mood disturbance, and anxiety: F02.80

## 2023-05-19 SURGERY — COLONOSCOPY WITH PROPOFOL
Anesthesia: General

## 2023-05-19 MED ORDER — SODIUM CHLORIDE 0.9 % IV SOLN: INTRAVENOUS | Status: DC

## 2023-05-19 NOTE — Care Plan (Signed)
 Brief GI Care Plan  Unfortunately, patient's Bms were still brown. Colonoscopy is to be rescheduled. Husband was present and informed.  Due to patient's rectal discomfort, I performed a rectal exam with patient and husband's permission.  Digital recal exam  Digital rectal exam performed with patient permission. Chaperone, Robin (nurse), present during exam. Preserved resting sphincter tone, Voluntary squeeze/tone preserved No prolapse, perianal lesion or skin tags, fissure, fistula, stricture, mass or abscess. No external hemorrhoids; scant brown stool. no blood on exam or mucus. no stool in vault  Patient tolerated exam.  We will reschedule colonoscopy in near future. Instructed to start one capful of miralax daily starting tomorrow.  Enis Slipper, DO [facility] Gastroenterology

## 2023-05-19 NOTE — H&P (Signed)
 Pt to be reschedule due to incomplete prep. See care plan note.  Enis Slipper, DO [facility] Gastroenterology

## 2023-05-19 NOTE — Progress Notes (Signed)
 Patient says she drank all the prep for the colonoscopy but brown murky liquid is still coming out. Says she didn't know to drink water behind the prep. Says she has been diagnosed with dementia, therefore husband came in and helped with history. Husband says that dementia was ruled out and progressive aphasia is the diagnosis. Dr. Timothy Lasso spoke with patient and husband, instructions reviewed and plan to reschedule the colonoscopy was agreed.

## 2023-05-20 ENCOUNTER — Encounter: Payer: Self-pay | Admitting: Gastroenterology

## 2023-06-07 ENCOUNTER — Encounter: Payer: Self-pay | Admitting: Gastroenterology

## 2023-06-13 ENCOUNTER — Encounter: Payer: BC Managed Care – PPO | Admitting: Speech Pathology

## 2023-06-16 ENCOUNTER — Ambulatory Visit
Admission: RE | Admit: 2023-06-16 | Discharge: 2023-06-16 | Disposition: A | Discharge status: Home / Self Care | Attending: Gastroenterology | Admitting: Gastroenterology

## 2023-06-16 ENCOUNTER — Ambulatory Visit: Admitting: Registered Nurse

## 2023-06-16 ENCOUNTER — Encounter
Admission: RE | Disposition: A | Discharge status: Home / Self Care | Payer: Self-pay | Source: Home / Self Care | Attending: Gastroenterology

## 2023-06-16 ENCOUNTER — Encounter: Payer: Self-pay | Admitting: Gastroenterology

## 2023-06-16 DIAGNOSIS — K5909 Other constipation: Secondary | ICD-10-CM | POA: Diagnosis not present

## 2023-06-16 DIAGNOSIS — K921 Melena: Secondary | ICD-10-CM | POA: Diagnosis present

## 2023-06-16 DIAGNOSIS — F0280 Dementia in other diseases classified elsewhere without behavioral disturbance: Secondary | ICD-10-CM | POA: Diagnosis not present

## 2023-06-16 DIAGNOSIS — K626 Ulcer of anus and rectum: Secondary | ICD-10-CM | POA: Diagnosis not present

## 2023-06-16 DIAGNOSIS — Z83719 Family history of colon polyps, unspecified: Secondary | ICD-10-CM | POA: Insufficient documentation

## 2023-06-16 HISTORY — PX: COLONOSCOPY: SHX5424

## 2023-06-16 HISTORY — DX: Unspecified dementia, unspecified severity, without behavioral disturbance, psychotic disturbance, mood disturbance, and anxiety: F03.90

## 2023-06-16 SURGERY — COLONOSCOPY
Anesthesia: General

## 2023-06-16 MED ORDER — PROPOFOL 500 MG/50ML IV EMUL
INTRAVENOUS | Status: DC | PRN
  Administered 2023-06-16: 140 ug/kg/min via INTRAVENOUS

## 2023-06-16 MED ORDER — SODIUM CHLORIDE 0.9 % IV SOLN: INTRAVENOUS | Status: DC

## 2023-06-16 MED ORDER — PROPOFOL 1000 MG/100ML IV EMUL
INTRAVENOUS | Status: AC
  Filled 2023-06-16: qty 100

## 2023-06-16 MED ORDER — PROPOFOL 10 MG/ML IV BOLUS
INTRAVENOUS | Status: DC | PRN
  Administered 2023-06-16: 70 mg via INTRAVENOUS

## 2023-06-16 NOTE — Interval H&P Note (Signed)
 History and Physical Interval Note: Preprocedure H&P from 06/16/23  was reviewed and there was no interval change after seeing and examining the patient.  Written consent was obtained from the patient after discussion of risks, benefits, and alternatives. Patient has consented to proceed with Colonoscopy with possible intervention   06/16/2023 7:35 AM  Susan Patterson  has presented today for surgery, with the diagnosis of Chronic constipation (K59.09) Rectal bleeding (K62.5).  The various methods of treatment have been discussed with the patient and family. After consideration of risks, benefits and other options for treatment, the patient has consented to  Procedure(s): COLONOSCOPY (N/A) as a surgical intervention.  The patient's history has been reviewed, patient examined, no change in status, stable for surgery.  I have reviewed the patient's chart and labs.  Questions were answered to the patient's satisfaction.     Jaynie Collins

## 2023-06-16 NOTE — Anesthesia Preprocedure Evaluation (Signed)
 Anesthesia Evaluation  Patient identified by MRN, date of birth, ID band Patient awake    Reviewed: Allergy & Precautions, NPO status , Patient's Chart, lab work & pertinent test results  History of Anesthesia Complications Negative for: history of anesthetic complications  Airway Mallampati: II  TM Distance: >3 FB Neck ROM: full    Dental no notable dental hx.    Pulmonary neg pulmonary ROS   Pulmonary exam normal        Cardiovascular Normal cardiovascular exam+ Valvular Problems/Murmurs MVP      Neuro/Psych  PSYCHIATRIC DISORDERS     Dementia negative neurological ROS     GI/Hepatic negative GI ROS, Neg liver ROS,,,  Endo/Other  negative endocrine ROS    Renal/GU negative Renal ROS  negative genitourinary   Musculoskeletal   Abdominal   Peds  Hematology negative hematology ROS (+)   Anesthesia Other Findings Past Medical History: No date: Dementia (HCC) No date: Heart murmur No date: Hyperlipidemia No date: Mitral valve prolapse No date: Primary progressive aphasia (HCC)     Comment:  per husband  Past Surgical History: No date: BIOPSY BREAST 1990'S: BREAST CYST ASPIRATION; Left 1990's: BREAST EXCISIONAL BIOPSY; Left     Comment:  benign No date: CHOLECYSTECTOMY 05/19/2023: COLONOSCOPY WITH PROPOFOL; N/A     Comment:  Procedure: COLONOSCOPY WITH PROPOFOL;  Surgeon: Jaynie Collins, DO;  Location: [facility] ENDOSCOPY;  Service:               Gastroenterology;  Laterality: N/A;  BMI    Body Mass Index: 25.50 kg/m      Reproductive/Obstetrics negative OB ROS                             Anesthesia Physical Anesthesia Plan  ASA: 2  Anesthesia Plan: General   Post-op Pain Management: Minimal or no pain anticipated   Induction: Intravenous  PONV Risk Score and Plan: 2 and Propofol infusion and TIVA  Airway Management Planned: Natural Airway and Nasal  Cannula  Additional Equipment:   Intra-op Plan:   Post-operative Plan:   Informed Consent: I have reviewed the patients History and Physical, chart, labs and discussed the procedure including the risks, benefits and alternatives for the proposed anesthesia with the patient or authorized representative who has indicated his/her understanding and acceptance.     Dental Advisory Given  Plan Discussed with: Anesthesiologist, CRNA and Surgeon  Anesthesia Plan Comments: (Patient consented for risks of anesthesia including but not limited to:  - adverse reactions to medications - risk of airway placement if required - damage to eyes, teeth, lips or other oral mucosa - nerve damage due to positioning  - sore throat or hoarseness - Damage to heart, brain, nerves, lungs, other parts of body or loss of life  Patient voiced understanding and assent.)        Anesthesia Quick Evaluation

## 2023-06-16 NOTE — Op Note (Signed)
 [facility] Gastroenterology Patient Name: Susan Patterson Procedure Date: 06/16/2023 7:05 AM MRN: [id number] Account #: [id number] Date of Birth: 08-16-1955 Admit Type: Outpatient Age: 68 Room: [facility] ENDO ROOM 1 Gender: Female Note Status: Finalized Instrument Name: Colonscope [id number] Procedure:             Colonoscopy Indications:           Hematochezia, Incidental constipation noted Providers:             Jaynie Collins DO, DO Referring MD:          Marilynne Halsted, MD (Referring MD) Medicines:             Monitored Anesthesia Care Complications:         No immediate complications. Estimated blood loss:                         Minimal. Procedure:             Pre-Anesthesia Assessment:                        - Prior to the procedure, a History and Physical was                         performed, and patient medications and allergies were                         reviewed. The patient is unable to give consent                         secondary to dementia. The risks and benefits of the                         procedure and the sedation options and risks were                         discussed with the patient's spouse. All questions                         were answered and informed consent was obtained.                         Patient identification and proposed procedure were                         verified by the physician, the nurse, the anesthetist                         and the technician in the endoscopy suite. Mental                         Status Examination: alert and oriented. Airway                         Examination: normal oropharyngeal airway and neck                         mobility. Respiratory Examination: clear to  auscultation. CV Examination: RRR, no murmurs, no S3                         or S4. Prophylactic Antibiotics: The patient does not                         require prophylactic antibiotics. Prior                          Anticoagulants: The patient has taken no anticoagulant                         or antiplatelet agents. ASA Grade Assessment: III - A                         patient with severe systemic disease. After reviewing                         the risks and benefits, the patient was deemed in                         satisfactory condition to undergo the procedure. The                         anesthesia plan was to use monitored anesthesia care                         (MAC). Immediately prior to administration of                         medications, the patient was re-assessed for adequacy                         to receive sedatives. The heart rate, respiratory                         rate, oxygen saturations, blood pressure, adequacy of                         pulmonary ventilation, and response to care were                         monitored throughout the procedure. The physical                         status of the patient was re-assessed after the                         procedure.                        After obtaining informed consent, the colonoscope was                         passed under direct vision. Throughout the procedure,                         the patient's blood pressure, pulse, and oxygen  saturations were monitored continuously. The                         Colonoscope was introduced through the anus and                         advanced to the the cecum, identified by appendiceal                         orifice and ileocecal valve. The colonoscopy was                         performed without difficulty. The patient tolerated                         the procedure well. The quality of the bowel                         preparation was evaluated using the BBPS [facility] Bowel                         Preparation Scale) with scores of: Right Colon = 2                         (minor amount of residual staining, small fragments of                          stool and/or opaque liquid, but mucosa seen well),                         Transverse Colon = 2 (minor amount of residual                         staining, small fragments of stool and/or opaque                         liquid, but mucosa seen well) and Left Colon = 2                         (minor amount of residual staining, small fragments of                         stool and/or opaque liquid, but mucosa seen well). The                         total BBPS score equals 6. The quality of the bowel                         preparation was good. The ileocecal valve, appendiceal                         orifice, and rectum were photographed. Findings:      The perianal and digital rectal examinations were normal. Pertinent       negatives include normal sphincter tone.      A moderate amount of liquid stool was found in the entire colon,       interfering with visualization. Lavage  of the area was performed,       resulting in clearance with good visualization. Estimated blood loss:       none.      A single (solitary) seven mm ulcer was found in the rectum. No bleeding       was present. No stigmata of recent bleeding were seen. Biopsies were       taken with a cold forceps for histology. Consistent with stercoral       colitis Estimated blood loss was minimal.      The exam was otherwise without abnormality on direct and retroflexion       views. Impression:            - Stool in the entire examined colon.                        - A single (solitary) ulcer in the rectum. Biopsied.                        - The examination was otherwise normal on direct and                         retroflexion views. Recommendation:        - Patient has a contact number available for                         emergencies. The signs and symptoms of potential                         delayed complications were discussed with the patient.                         Return to normal activities tomorrow. Written                          discharge instructions were provided to the patient.                        - Discharge patient to home.                        - Resume previous diet.                        - Continue present medications.                        - Await pathology results.                        - Repeat colonoscopy for surveillance based on                         pathology results.                        - Return to GI office as previously scheduled.                        - The findings and recommendations were discussed with  the patient.                        - The findings and recommendations were discussed with                         the patient's family. Procedure Code(s):     --- Professional ---                        [phone], Colonoscopy, flexible; with biopsy, single or                         multiple Diagnosis Code(s):     --- Professional ---                        K62.6, Ulcer of anus and rectum                        K92.1, Melena (includes Hematochezia) CPT copyright 2022 American Medical Association. All rights reserved. The codes documented in this report are preliminary and upon coder review may  be revised to meet current compliance requirements. Attending Participation:      I personally performed the entire procedure. Elfredia Nevins, DO Jaynie Collins DO, DO 06/16/2023 8:45:08 AM This report has been signed electronically. Number of Addenda: 0 Note Initiated On: 06/16/2023 7:05 AM Scope Withdrawal Time: 0 hours 12 minutes 10 seconds  Total Procedure Duration: 0 hours 20 minutes 31 seconds  Estimated Blood Loss:  Estimated blood loss was minimal.      [facility]

## 2023-06-16 NOTE — Anesthesia Postprocedure Evaluation (Signed)
 Anesthesia Post Note  Patient: Princella Jaskiewicz  Procedure(s) Performed: COLONOSCOPY  Patient location during evaluation: Endoscopy Anesthesia Type: General Level of consciousness: awake and alert Pain management: pain level controlled Vital Signs Assessment: post-procedure vital signs reviewed and stable Respiratory status: spontaneous breathing, nonlabored ventilation, respiratory function stable and patient connected to nasal cannula oxygen Cardiovascular status: blood pressure returned to baseline and stable Postop Assessment: no apparent nausea or vomiting Anesthetic complications: no   No notable events documented.   Last Vitals:  Vitals:   06/16/23 0849 06/16/23 0859  BP: (!) 113/95 125/80  Pulse:    Resp:    Temp:    SpO2:      Last Pain:  Vitals:   06/16/23 0859  TempSrc:   PainSc: 0-No pain                 Louie Boston

## 2023-06-16 NOTE — H&P (Signed)
 Pre-Procedure H&P   Patient ID: Susan Patterson is a 68 y.o. female.  Gastroenterology Provider: Jaynie Collins, DO  Referring Provider: Tawni Pummel, PA PCP: Patrice Paradise, MD  Date: 06/16/2023  HPI Susan Patterson is a 68 y.o. female who presents today for Colonoscopy for Bright red blood per rectum, chronic constipation, family history of colon polyps .  Husband present at bedside who provides consent given h/o dementia  Chronic constipation. BRBPR x1 per month. Noted straining. Currently having BM every other day.  No abdominal pain.  Mother and sister with colon polyps  11/2017- CSY negative; 2008 negative  Hgb 13.6 mcv  92 plt 196K Cr 0.9   Past Medical History:  Diagnosis Date   Dementia (HCC)    Heart murmur    Hyperlipidemia    Mitral valve prolapse    Primary progressive aphasia (HCC)    per husband    Past Surgical History:  Procedure Laterality Date   BIOPSY BREAST     BREAST CYST ASPIRATION Left 1990'S   BREAST EXCISIONAL BIOPSY Left 1990's   benign   CHOLECYSTECTOMY     COLONOSCOPY WITH PROPOFOL N/A 05/19/2023   Procedure: COLONOSCOPY WITH PROPOFOL;  Surgeon: Jaynie Collins, DO;  Location: [facility] ENDOSCOPY;  Service: Gastroenterology;  Laterality: N/A;    Family History Mother and sister- colon polyps No other h/o GI disease or malignancy  Review of Systems  Constitutional:  Negative for activity change, appetite change, chills, diaphoresis, fatigue, fever and unexpected weight change.  HENT:  Negative for trouble swallowing and voice change.   Respiratory:  Negative for shortness of breath and wheezing.   Cardiovascular:  Negative for chest pain, palpitations and leg swelling.  Gastrointestinal:  Negative for abdominal distention, abdominal pain, anal bleeding, blood in stool, constipation, diarrhea, nausea, rectal pain and vomiting.  Musculoskeletal:  Negative for arthralgias and myalgias.  Skin:   Negative for color change and pallor.  Neurological:  Negative for dizziness, syncope and weakness.  All other systems reviewed and are negative.    Medications No current facility-administered medications on file prior to encounter.   Current Outpatient Medications on File Prior to Encounter  Medication Sig Dispense Refill   aspirin EC 81 MG tablet Take 81 mg by mouth daily. Swallow whole.     Cholecalciferol (VITAMIN D-1000 MAX ST) 25 MCG (1000 UT) tablet Take 1,000 Units by mouth daily.     HYDROcodone-acetaminophen (NORCO) 5-325 MG tablet Take 1 tablet by mouth every 6 (six) hours as needed for moderate pain. (Patient not taking: Reported on 05/19/2023) 15 tablet 0   memantine (NAMENDA) 10 MG tablet Take 1 tablet by mouth 2 (two) times daily. (Patient not taking: Reported on 05/19/2023)     tretinoin (RETIN-A) 0.025 % cream Apply 1 application  topically at bedtime. Apply as directed sparingly every night.      Pertinent medications related to GI and procedure were reviewed by me with the patient prior to the procedure   Current Facility-Administered Medications:    0.9 %  sodium chloride infusion, , Intravenous, Continuous, Jaynie Collins, DO  sodium chloride         Allergies  Allergen Reactions   Atorvastatin Swelling    Other reaction(s): Angioedema   Allergies were reviewed by me prior to the procedure  Objective   Body mass index is 25.5 kg/m. Vitals:   06/16/23 0722 06/16/23 0733  BP:  119/88  Pulse:  98  Resp:  16  Temp:  97.9 F (36.6 C)  TempSrc:  Temporal  SpO2:  98%  Weight: 71.7 kg   Height: 5\' 6"  (1.676 m)      Physical Exam Vitals and nursing note reviewed.  Constitutional:      General: She is not in acute distress.    Appearance: Normal appearance. She is not ill-appearing, toxic-appearing or diaphoretic.  HENT:     Head: Normocephalic and atraumatic.     Nose: Nose normal.     Mouth/Throat:     Mouth: Mucous membranes are moist.      Pharynx: Oropharynx is clear.  Eyes:     General: No scleral icterus.    Extraocular Movements: Extraocular movements intact.  Cardiovascular:     Rate and Rhythm: Normal rate and regular rhythm.     Heart sounds: Normal heart sounds. No murmur heard.    No friction rub. No gallop.  Pulmonary:     Effort: Pulmonary effort is normal. No respiratory distress.     Breath sounds: Normal breath sounds. No wheezing, rhonchi or rales.  Abdominal:     General: Bowel sounds are normal. There is no distension.     Palpations: Abdomen is soft.     Tenderness: There is no abdominal tenderness. There is no guarding or rebound.  Musculoskeletal:     Cervical back: Neck supple.     Right lower leg: No edema.     Left lower leg: No edema.  Skin:    General: Skin is warm and dry.     Coloration: Skin is not jaundiced or pale.  Neurological:     Mental Status: She is alert. Mental status is at baseline.      Assessment:  Susan Patterson is a 68 y.o. female  who presents today for Colonoscopy for Bright red blood per rectum, chronic constipation, family history of colon polyps .  Plan:  Colonoscopy with possible intervention today  Colonoscopy with possible biopsy, control of bleeding, polypectomy, and interventions as necessary has been discussed with the patient/patient representative. Informed consent was obtained from the patient/patient representative after explaining the indication, nature, and risks of the procedure including but not limited to death, bleeding, perforation, missed neoplasm/lesions, cardiorespiratory compromise, and reaction to medications. Opportunity for questions was given and appropriate answers were provided. Patient/patient representative has verbalized understanding is amenable to undergoing the procedure.   Jaynie Collins, DO  [facility]enter[facility]  Portions of the record may have been created with voice recognition software.  Occasional wrong-word or 'sound-a-like' substitutions may have occurred due to the inherent limitations of voice recognition software.  Read the chart carefully and recognize, using context, where substitutions may have occurred.

## 2023-06-16 NOTE — Transfer of Care (Signed)
 Immediate Anesthesia Transfer of Care Note  Patient: Susan Patterson  Procedure(s) Performed: COLONOSCOPY  Patient Location: PACU  Anesthesia Type:General  Level of Consciousness: awake, alert , and oriented  Airway & Oxygen Therapy: Patient Spontanous Breathing  Post-op Assessment: Report given to RN and Post -op Vital signs reviewed and stable  Post vital signs: Reviewed and stable  Last Vitals:  Vitals Value Taken Time  BP 98/57 06/16/23 0839  Temp 36.1 C 06/16/23 0839  Pulse 70 06/16/23 0840  Resp 14 06/16/23 0840  SpO2 97 % 06/16/23 0840  Vitals shown include unfiled device data.  Last Pain:  Vitals:   06/16/23 0839  TempSrc: Temporal  PainSc: 0-No pain         Complications: No notable events documented.

## 2023-06-17 ENCOUNTER — Encounter: Payer: Self-pay | Admitting: Gastroenterology

## 2023-06-17 LAB — SURGICAL PATHOLOGY

## 2023-12-05 ENCOUNTER — Other Ambulatory Visit: Payer: Self-pay | Admitting: Physician Assistant

## 2023-12-05 DIAGNOSIS — Z1231 Encounter for screening mammogram for malignant neoplasm of breast: Secondary | ICD-10-CM

## 2024-01-06 ENCOUNTER — Ambulatory Visit
Admission: RE | Admit: 2024-01-06 | Discharge: 2024-01-06 | Disposition: A | Discharge status: Home / Self Care | Source: Ambulatory Visit | Attending: Physician Assistant | Admitting: Physician Assistant

## 2024-01-06 DIAGNOSIS — Z1231 Encounter for screening mammogram for malignant neoplasm of breast: Secondary | ICD-10-CM | POA: Diagnosis present
